# Patient Record
Sex: Female | Born: 1975 | Race: White | Hispanic: No | Marital: Married | Smoking: Current every day smoker
Health system: Southern US, Community
[De-identification: ages and names within clinical notes are randomized; demographics above are authoritative.]

## PROBLEM LIST (undated history)

## (undated) DIAGNOSIS — Z9851 Tubal ligation status: Secondary | ICD-10-CM

## (undated) DIAGNOSIS — N200 Calculus of kidney: Secondary | ICD-10-CM

## (undated) HISTORY — DX: Tubal ligation status: Z98.51

## (undated) HISTORY — PX: WISDOM TOOTH EXTRACTION: SHX21

## (undated) HISTORY — DX: Calculus of kidney: N20.0

## (undated) HISTORY — PX: LITHOTRIPSY: SUR834

## (undated) HISTORY — PX: TUBAL LIGATION: SHX77

## (undated) HISTORY — PX: PARTIAL HYSTERECTOMY: SHX80

---

## 2015-05-11 ENCOUNTER — Encounter: Payer: Self-pay | Admitting: Family

## 2015-05-11 ENCOUNTER — Ambulatory Visit (INDEPENDENT_AMBULATORY_CARE_PROVIDER_SITE_OTHER): Payer: BLUE CROSS/BLUE SHIELD | Admitting: Family

## 2015-05-11 VITALS — BP 142/99 | HR 78 | Temp 98.0°F | Ht 63.0 in | Wt 201.0 lb

## 2015-05-11 DIAGNOSIS — M545 Low back pain, unspecified: Secondary | ICD-10-CM

## 2015-05-11 DIAGNOSIS — B373 Candidiasis of vulva and vagina: Secondary | ICD-10-CM

## 2015-05-11 DIAGNOSIS — B3731 Acute candidiasis of vulva and vagina: Secondary | ICD-10-CM

## 2015-05-11 DIAGNOSIS — N200 Calculus of kidney: Secondary | ICD-10-CM | POA: Diagnosis not present

## 2015-05-11 LAB — POCT URINALYSIS DIPSTICK
BILIRUBIN UA: NEGATIVE
GLUCOSE UA: NEGATIVE
KETONES UA: NEGATIVE
Nitrite, UA: NEGATIVE
PH UA: 7.5
Protein, UA: NEGATIVE
RBC UA: NEGATIVE
SPEC GRAV UA: 1.01
Urobilinogen, UA: NEGATIVE

## 2015-05-11 LAB — POCT UA - MICROSCOPIC ONLY
Bacteria, U Microscopic: NEGATIVE
CASTS, UR, LPF, POC: NEGATIVE
CRYSTALS, UR, HPF, POC: NEGATIVE
Mucus, UA: NEGATIVE
RBC, URINE, MICROSCOPIC: NEGATIVE

## 2015-05-11 MED ORDER — HYDROCODONE-ACETAMINOPHEN 10-325 MG PO TABS: 1.0000 | ORAL_TABLET | Freq: Four times a day (QID) | ORAL | Status: DC | PRN

## 2015-05-11 MED ORDER — FLUCONAZOLE 150 MG PO TABS: 150.0000 mg | ORAL_TABLET | Freq: Once | ORAL | Status: DC

## 2015-05-11 NOTE — Patient Instructions (Signed)
Kidney Stones Kidney stones (urolithiasis) are deposits that form inside your kidneys. The intense pain is caused by the stone moving through the urinary tract. When the stone moves, the ureter goes into spasm around the stone. The stone is usually passed in the urine.  CAUSES   A disorder that makes certain neck glands produce too much parathyroid hormone (primary hyperparathyroidism).  A buildup of uric acid crystals, similar to gout in your joints.  Narrowing (stricture) of the ureter.  A kidney obstruction present at birth (congenital obstruction).  Previous surgery on the kidney or ureters.  Numerous kidney infections. SYMPTOMS   Feeling sick to your stomach (nauseous).  Throwing up (vomiting).  Blood in the urine (hematuria).  Pain that usually spreads (radiates) to the groin.  Frequency or urgency of urination. DIAGNOSIS   Taking a history and physical exam.  Blood or urine tests.  CT scan.  Occasionally, an examination of the inside of the urinary bladder (cystoscopy) is performed. TREATMENT   Observation.  Increasing your fluid intake.  Extracorporeal shock wave lithotripsy--This is a noninvasive procedure that uses shock waves to break up kidney stones.  Surgery may be needed if you have severe pain or persistent obstruction. There are various surgical procedures. Most of the procedures are performed with the use of small instruments. Only small incisions are needed to accommodate these instruments, so recovery time is minimized. The size, location, and chemical composition are all important variables that will determine the proper choice of action for you. Talk to your health care provider to better understand your situation so that you will minimize the risk of injury to yourself and your kidney.  HOME CARE INSTRUCTIONS   Drink enough water and fluids to keep your urine clear or pale yellow. This will help you to pass the stone or stone fragments.  Strain  all urine through the provided strainer. Keep all particulate matter and stones for your health care provider to see. The stone causing the pain may be as small as a grain of salt. It is very important to use the strainer each and every time you pass your urine. The collection of your stone will allow your health care provider to analyze it and verify that a stone has actually passed. The stone analysis will often identify what you can do to reduce the incidence of recurrences.  Only take over-the-counter or prescription medicines for pain, discomfort, or fever as directed by your health care provider.  Keep all follow-up visits as told by your health care provider. This is important.  Get follow-up X-rays if required. The absence of pain does not always mean that the stone has passed. It may have only stopped moving. If the urine remains completely obstructed, it can cause loss of kidney function or even complete destruction of the kidney. It is your responsibility to make sure X-rays and follow-ups are completed. Ultrasounds of the kidney can show blockages and the status of the kidney. Ultrasounds are not associated with any radiation and can be performed easily in a matter of minutes.  Make changes to your daily diet as told by your health care provider. You may be told to:  Limit the amount of salt that you eat.  Eat 5 or more servings of fruits and vegetables each day.  Limit the amount of meat, poultry, fish, and eggs that you eat.  Collect a 24-hour urine sample as told by your health care provider.You may need to collect another urine sample every 6-12   months. SEEK MEDICAL CARE IF:  You experience pain that is progressive and unresponsive to any pain medicine you have been prescribed. SEEK IMMEDIATE MEDICAL CARE IF:   Pain cannot be controlled with the prescribed medicine.  You have a fever or shaking chills.  The severity or intensity of pain increases over 18 hours and is not  relieved by pain medicine.  You develop a new onset of abdominal pain.  You feel faint or pass out.  You are unable to urinate.   This information is not intended to replace advice given to you by your health care provider. Make sure you discuss any questions you have with your health care provider.   Document Released: 07/15/2005 Document Revised: 04/05/2015 Document Reviewed: 12/16/2012 Elsevier Interactive Patient Education 2016 [employer] Dietary Guidelines to Help Prevent Kidney Stones Your risk of kidney stones can be decreased by adjusting the foods you eat. The most important thing you can do is drink enough fluid. You should drink enough fluid to keep your urine clear or pale yellow. The following guidelines provide specific information for the type of kidney stone you have had. GUIDELINES ACCORDING TO TYPE OF KIDNEY STONE Calcium Oxalate Kidney Stones  Reduce the amount of salt you eat. Foods that have a lot of salt cause your body to release excess calcium into your urine. The excess calcium can combine with a substance called oxalate to form kidney stones.  Reduce the amount of animal protein you eat if the amount you eat is excessive. Animal protein causes your body to release excess calcium into your urine. Ask your dietitian how much protein from animal sources you should be eating.  Avoid foods that are high in oxalates. If you take vitamins, they should have less than 500 mg of vitamin C. Your body turns vitamin C into oxalates. You do not need to avoid fruits and vegetables high in vitamin C. Calcium Phosphate Kidney Stones  Reduce the amount of salt you eat to help prevent the release of excess calcium into your urine.  Reduce the amount of animal protein you eat if the amount you eat is excessive. Animal protein causes your body to release excess calcium into your urine. Ask your dietitian how much protein from animal sources you should be eating.  Get enough  calcium from food or take a calcium supplement (ask your dietitian for recommendations). Food sources of calcium that do not increase your risk of kidney stones include:  Broccoli.  Dairy products, such as cheese and yogurt.  Pudding. Uric Acid Kidney Stones  Do not have more than 6 oz of animal protein per day. FOOD SOURCES Animal Protein Sources  Meat (all types).  Poultry.  Eggs.  Fish, seafood. Foods High in Salt  Salt seasonings.  Soy sauce.  Teriyaki sauce.  Cured and processed meats.  Salted crackers and snack foods.  Fast food.  Canned soups and most canned foods. Foods High in Oxalates  Grains:  Amaranth.  Barley.  Grits.  Wheat germ.  Bran.  Buckwheat flour.  All bran cereals.  Pretzels.  Whole wheat bread.  Vegetables:  Beans (wax).  Beets and beet greens.  Collard greens.  Eggplant.  Escarole.  Leeks.  Okra.  Parsley.  Rutabagas.  Spinach.  Swiss chard.  Tomato paste.  Fried potatoes.  Sweet potatoes.  Fruits:  Red currants.  Figs.  Kiwi.  Rhubarb.  Meat and Other Protein Sources:  Beans (dried).  Soy burgers and other soybean products.  Miso.    Nuts (peanuts, almonds, pecans, cashews, hazelnuts).  Nut butters.  Sesame seeds and tahini (paste made of sesame seeds).  Poppy seeds.  Beverages:  Chocolate drink mixes.  Soy milk.  Instant iced tea.  Juices made from high-oxalate fruits or vegetables.  Other:  Carob.  Chocolate.  Fruitcake.  Marmalades.   This information is not intended to replace advice given to you by your health care provider. Make sure you discuss any questions you have with your health care provider.   Document Released: 11/09/2010 Document Revised: 07/20/2013 Document Reviewed: 06/11/2013 Elsevier Interactive Patient Education 2016 [employer] Inc[employer]  

## 2015-05-11 NOTE — Progress Notes (Signed)
   Subjective:    Patient ID: Tammie Bradshaw, female    DOB: 1976-01-17, 39 y.o.   MRN: [id number]  HPI Pt presents to the office today for right lower abd pain. Pt states she has had a CT scan that showed she had a total of 8 kidney stones (4 in the right and 4 in left kidney). Pt states she is in a 7-8 out of 10 intermittent pain. Pt states she has "passed three kidney stones" in the past and this pain is the same. Pt states she is able to pass urine at this time and no blood in her urine "yet".    Review of Systems  Constitutional: Negative.   HENT: Negative.   Eyes: Negative.   Respiratory: Negative.  Negative for shortness of breath.   Cardiovascular: Negative.  Negative for palpitations.  Gastrointestinal: Negative.   Endocrine: Negative.   Genitourinary: Negative.   Musculoskeletal: Negative.   Neurological: Negative.  Negative for headaches.  Hematological: Negative.   Psychiatric/Behavioral: Negative.   All other systems reviewed and are negative.      Objective:   Physical Exam  Constitutional: She is oriented to person, place, and time. She appears well-developed and well-nourished. No distress.  HENT:  Head: Normocephalic and atraumatic.  Right Ear: External ear normal.  Left Ear: External ear normal.  Nose: Nose normal.  Mouth/Throat: Oropharynx is clear and moist.  Eyes: Pupils are equal, round, and reactive to light.  Neck: Normal range of motion. Neck supple. No thyromegaly present.  Cardiovascular: Normal rate, regular rhythm, normal heart sounds and intact distal pulses.   No murmur heard. Pulmonary/Chest: Effort normal and breath sounds normal. No respiratory distress. She has no wheezes.  Abdominal: Soft. Bowel sounds are normal. She exhibits no distension. There is no tenderness.  Musculoskeletal: Normal range of motion. She exhibits no edema or tenderness.  Neurological: She is alert and oriented to person, place, and time. She has normal reflexes. No  cranial nerve deficit.  Skin: Skin is warm and dry.  Psychiatric: She has a normal mood and affect. Her behavior is normal. Judgment and thought content normal.  Vitals reviewed.     BP 142/99 mmHg  Pulse 78  Temp(Src) 98 F (36.7 C) (Oral)  Ht 5\' 3"  (1.6 m)  Wt 201 lb (91.173 kg)  BMI 35.61 kg/m2     Assessment & Plan:  1. Right-sided low back pain without sciatica - POCT urinalysis dipstick - POCT UA - Microscopic Only  2. Kidney stone on right side -Low salt diet - Ambulatory referral to Urology - HYDROcodone-acetaminophen (NORCO) 10-325 MG tablet; Take 1 tablet by mouth every 6 (six) hours as needed.  Dispense: 45 tablet; Refill: 0  3. Yeast vaginitis -Keep clean and dry -Cotton underwear - fluconazole (DIFLUCAN) 150 MG tablet; Take 1 tablet (150 mg total) by mouth once.  Dispense: 1 tablet; Refill: 0   Jannifer Rodneyhristy Kmari Halter, FNP

## 2015-05-13 LAB — URINE CULTURE

## 2015-05-25 ENCOUNTER — Ambulatory Visit (INDEPENDENT_AMBULATORY_CARE_PROVIDER_SITE_OTHER): Payer: BLUE CROSS/BLUE SHIELD | Admitting: Family

## 2015-05-25 ENCOUNTER — Encounter: Payer: Self-pay | Admitting: Family

## 2015-05-25 VITALS — BP 137/86 | HR 76 | Temp 97.2°F | Ht 63.0 in | Wt 201.0 lb

## 2015-05-25 DIAGNOSIS — Z0000 Encounter for general adult medical examination without abnormal findings: Secondary | ICD-10-CM | POA: Diagnosis not present

## 2015-05-25 DIAGNOSIS — Z01419 Encounter for gynecological examination (general) (routine) without abnormal findings: Secondary | ICD-10-CM

## 2015-05-25 DIAGNOSIS — L989 Disorder of the skin and subcutaneous tissue, unspecified: Secondary | ICD-10-CM

## 2015-05-25 LAB — POCT UA - MICROSCOPIC ONLY
Bacteria, U Microscopic: NEGATIVE
CASTS, UR, LPF, POC: NEGATIVE
Crystals, Ur, HPF, POC: NEGATIVE
Mucus, UA: NEGATIVE
WBC, Ur, HPF, POC: NEGATIVE
YEAST UA: NEGATIVE

## 2015-05-25 LAB — POCT URINALYSIS DIPSTICK
Bilirubin, UA: NEGATIVE
Glucose, UA: NEGATIVE
Ketones, UA: NEGATIVE
Leukocytes, UA: NEGATIVE
NITRITE UA: NEGATIVE
PH UA: 5
Protein, UA: NEGATIVE
RBC UA: NEGATIVE
Spec Grav, UA: 1.015
UROBILINOGEN UA: NEGATIVE

## 2015-05-25 NOTE — Addendum Note (Signed)
Addended by: Gwenith DailyHUDY, Jimi Schappert N on: 05/25/2015 03:58 PM   Modules accepted: Orders

## 2015-05-25 NOTE — Addendum Note (Signed)
Addended by: Prescott GumLAND, Latwan Luchsinger M on: 05/25/2015 04:00 PM   Modules accepted: Kipp BroodSmartSet

## 2015-05-25 NOTE — Progress Notes (Signed)
   Subjective:    Patient ID: Tammie Bradshaw [facility], female    DOB: [birth year], 39 y.o.   MRN: [id number]  HPI Pt presents to the office today for CPE with pap. Pt currently not taking any medications. Pt states she has a rx for pain medication, but only uses if she has a kidney stone. Pt denies any headache, palpitations, SOB, or edema at this time.     Review of Systems  Constitutional: Negative.   HENT: Negative.   Eyes: Negative.   Respiratory: Negative.  Negative for shortness of breath.   Cardiovascular: Negative.  Negative for palpitations.  Gastrointestinal: Negative.   Endocrine: Negative.   Genitourinary: Negative.   Musculoskeletal: Negative.   Neurological: Negative.  Negative for headaches.  Hematological: Negative.   Psychiatric/Behavioral: Negative.   All other systems reviewed and are negative.      Objective:   Physical Exam  Constitutional: She is oriented to person, place, and time. She appears well-developed and well-nourished. No distress.  HENT:  Head: Normocephalic and atraumatic.  Right Ear: External ear normal.  Left Ear: External ear normal.  Nose: Nose normal.  Mouth/Throat: Oropharynx is clear and moist.  Eyes: Pupils are equal, round, and reactive to light.  Neck: Normal range of motion. Neck supple. No thyromegaly present.  Cardiovascular: Normal rate, regular rhythm, normal heart sounds and intact distal pulses.   No murmur heard. Pulmonary/Chest: Effort normal and breath sounds normal. No respiratory distress. She has no wheezes.  Abdominal: Soft. Bowel sounds are normal. She exhibits no distension. There is no tenderness.  Musculoskeletal: Normal range of motion. She exhibits no edema or tenderness.  Neurological: She is alert and oriented to person, place, and time. She has normal reflexes. No cranial nerve deficit.  Skin: Skin is warm and dry.  Psychiatric: She has a normal mood and affect. Her behavior is normal. Judgment and thought content  normal.  Vitals reviewed.   BP 137/86 mmHg  Pulse 76  Temp(Src) 97.2 F (36.2 C) (Oral)  Ht _0  (1.6 m)  Wt 201 lb (91.173 kg)  BMI 35.61 kg/m2       Assessment & Plan:  1. Annual physical exam - Anemia Profile B - CMP14+EGFR - Lipid panel - Vit D  25 hydroxy (rtn osteoporosis monitoring) - Thyroid Panel With TSH - Pap IG w/ reflex to HPV when ASC-U  2. Encounter for routine gynecological examination - Pap IG w/ reflex to HPV when ASC-U  3. Skin lesion - Ambulatory referral to Dermatology   Continue all meds Labs pending Health Maintenance reviewed Diet and exercise encouraged RTO 1 year  Evelina Dun, FNP

## 2015-05-25 NOTE — Patient Instructions (Signed)
Health Maintenance, Female Adopting a healthy lifestyle and getting preventive care can go a long way to promote health and wellness. Talk with your health care provider about what schedule of regular examinations is right for you. This is a good chance for you to check in with your provider about disease prevention and staying healthy. In between checkups, there are plenty of things you can do on your own. Experts have done a lot of research about which lifestyle changes and preventive measures are most likely to keep you healthy. Ask your health care provider for more information. WEIGHT AND DIET  Eat a healthy diet  Be sure to include plenty of vegetables, fruits, low-fat dairy products, and lean protein.  Do not eat a lot of foods high in solid fats, added sugars, or salt.  Get regular exercise. This is one of the most important things you can do for your health.  Most adults should exercise for at least 150 minutes each week. The exercise should increase your heart rate and make you sweat (moderate-intensity exercise).  Most adults should also do strengthening exercises at least twice a week. This is in addition to the moderate-intensity exercise.  Maintain a healthy weight  Body mass index (BMI) is a measurement that can be used to identify possible weight problems. It estimates body fat based on height and weight. Your health care provider can help determine your BMI and help you achieve or maintain a healthy weight.  For females 20 years of age and older:   A BMI below 18.5 is considered underweight.  A BMI of 18.5 to 24.9 is normal.  A BMI of 25 to 29.9 is considered overweight.  A BMI of 30 and above is considered obese.  Watch levels of cholesterol and blood lipids  You should start having your blood tested for lipids and cholesterol at 37 years of age, then have this test every 5 years.  You may need to have your cholesterol levels checked more often if:  Your lipid  or cholesterol levels are high.  You are older than 37 years of age.  You are at high risk for heart disease.  CANCER SCREENING   Lung Cancer  Lung cancer screening is recommended for adults 55-80 years old who are at high risk for lung cancer because of a history of smoking.  A yearly low-dose CT scan of the lungs is recommended for people who:  Currently smoke.  Have quit within the past 15 years.  Have at least a 30-pack-year history of smoking. A pack year is smoking an average of one pack of cigarettes a day for 1 year.  Yearly screening should continue until it has been 15 years since you quit.  Yearly screening should stop if you develop a health problem that would prevent you from having lung cancer treatment.  Breast Cancer  Practice breast self-awareness. This means understanding how your breasts normally appear and feel.  It also means doing regular breast self-exams. Let your health care provider know about any changes, no matter how small.  If you are in your 20s or 30s, you should have a clinical breast exam (CBE) by a health care provider every 1-3 years as part of a regular health exam.  If you are 40 or older, have a CBE every year. Also consider having a breast X-ray (mammogram) every year.  If you have a family history of breast cancer, talk to your health care provider about genetic screening.  If you   are at high risk for breast cancer, talk to your health care provider about having an MRI and a mammogram every year.  Breast cancer gene (BRCA) assessment is recommended for women who have family members with BRCA-related cancers. BRCA-related cancers include:  Breast.  Ovarian.  Tubal.  Peritoneal cancers.  Results of the assessment will determine the need for genetic counseling and BRCA1 and BRCA2 testing. Cervical Cancer Your health care provider may recommend that you be screened regularly for cancer of the pelvic organs (ovaries, uterus, and  vagina). This screening involves a pelvic examination, including checking for microscopic changes to the surface of your cervix (Pap test). You may be encouraged to have this screening done every 3 years, beginning at age 21.  For women ages 30-65, health care providers may recommend pelvic exams and Pap testing every 3 years, or they may recommend the Pap and pelvic exam, combined with testing for human papilloma virus (HPV), every 5 years. Some types of HPV increase your risk of cervical cancer. Testing for HPV may also be done on women of any age with unclear Pap test results.  Other health care providers may not recommend any screening for nonpregnant women who are considered low risk for pelvic cancer and who do not have symptoms. Ask your health care provider if a screening pelvic exam is right for you.  If you have had past treatment for cervical cancer or a condition that could lead to cancer, you need Pap tests and screening for cancer for at least 20 years after your treatment. If Pap tests have been discontinued, your risk factors (such as having a new sexual partner) need to be reassessed to determine if screening should resume. Some women have medical problems that increase the chance of getting cervical cancer. In these cases, your health care provider may recommend more frequent screening and Pap tests. Colorectal Cancer  This type of cancer can be detected and often prevented.  Routine colorectal cancer screening usually begins at 37 years of age and continues through 37 years of age.  Your health care provider may recommend screening at an earlier age if you have risk factors for colon cancer.  Your health care provider may also recommend using home test kits to check for hidden blood in the stool.  A small camera at the end of a tube can be used to examine your colon directly (sigmoidoscopy or colonoscopy). This is done to check for the earliest forms of colorectal  cancer.  Routine screening usually begins at age 50.  Direct examination of the colon should be repeated every 5-10 years through 37 years of age. However, you may need to be screened more often if early forms of precancerous polyps or small growths are found. Skin Cancer  Check your skin from head to toe regularly.  Tell your health care provider about any new moles or changes in moles, especially if there is a change in a mole's shape or color.  Also tell your health care provider if you have a mole that is larger than the size of a pencil eraser.  Always use sunscreen. Apply sunscreen liberally and repeatedly throughout the day.  Protect yourself by wearing long sleeves, pants, a wide-brimmed hat, and sunglasses whenever you are outside. HEART DISEASE, DIABETES, AND HIGH BLOOD PRESSURE   High blood pressure causes heart disease and increases the risk of stroke. High blood pressure is more likely to develop in:  People who have blood pressure in the high end   of the normal range (130-139/85-89 mm Hg).  People who are overweight or obese.  People who are African American.  If you are 38-23 years of age, have your blood pressure checked every 3-5 years. If you are 61 years of age or older, have your blood pressure checked every year. You should have your blood pressure measured twice--once when you are at a hospital or clinic, and once when you are not at a hospital or clinic. Record the average of the two measurements. To check your blood pressure when you are not at a hospital or clinic, you can use:  An automated blood pressure machine at a pharmacy.  A home blood pressure monitor.  If you are between 45 years and 39 years old, ask your health care provider if you should take aspirin to prevent strokes.  Have regular diabetes screenings. This involves taking a blood sample to check your fasting blood sugar level.  If you are at a normal weight and have a low risk for diabetes,  have this test once every three years after 37 years of age.  If you are overweight and have a high risk for diabetes, consider being tested at a younger age or more often. PREVENTING INFECTION  Hepatitis B  If you have a higher risk for hepatitis B, you should be screened for this virus. You are considered at high risk for hepatitis B if:  You were born in a country where hepatitis B is common. Ask your health care provider which countries are considered high risk.  Your parents were born in a high-risk country, and you have not been immunized against hepatitis B (hepatitis B vaccine).  You have HIV or AIDS.  You use needles to inject street drugs.  You live with someone who has hepatitis B.  You have had sex with someone who has hepatitis B.  You get hemodialysis treatment.  You take certain medicines for conditions, including cancer, organ transplantation, and autoimmune conditions. Hepatitis C  Blood testing is recommended for:  Everyone born from [birth year] through 1963.  Anyone with known risk factors for hepatitis C. Sexually transmitted infections (STIs)  You should be screened for sexually transmitted infections (STIs) including gonorrhea and chlamydia if:  You are sexually active and are younger than 37 years of age.  You are older than 37 years of age and your health care provider tells you that you are at risk for this type of infection.  Your sexual activity has changed since you were last screened and you are at an increased risk for chlamydia or gonorrhea. Ask your health care provider if you are at risk.  If you do not have HIV, but are at risk, it may be recommended that you take a prescription medicine daily to prevent HIV infection. This is called pre-exposure prophylaxis (PrEP). You are considered at risk if:  You are sexually active and do not regularly use condoms or know the HIV status of your partner(s).  You take drugs by injection.  You are sexually  active with a partner who has HIV. Talk with your health care provider about whether you are at high risk of being infected with HIV. If you choose to begin PrEP, you should first be tested for HIV. You should then be tested every 3 months for as long as you are taking PrEP.  PREGNANCY   If you are premenopausal and you may become pregnant, ask your health care provider about preconception counseling.  If you may  become pregnant, take 400 to 800 micrograms (mcg) of folic acid every day.  If you want to prevent pregnancy, talk to your health care provider about birth control (contraception). OSTEOPOROSIS AND MENOPAUSE   Osteoporosis is a disease in which the bones lose minerals and strength with aging. This can result in serious bone fractures. Your risk for osteoporosis can be identified using a bone density scan.  If you are 61 years of age or older, or if you are at risk for osteoporosis and fractures, ask your health care provider if you should be screened.  Ask your health care provider whether you should take a calcium or vitamin D supplement to lower your risk for osteoporosis.  Menopause may have certain physical symptoms and risks.  Hormone replacement therapy may reduce some of these symptoms and risks. Talk to your health care provider about whether hormone replacement therapy is right for you.  HOME CARE INSTRUCTIONS   Schedule regular health, dental, and eye exams.  Stay current with your immunizations.   Do not use any tobacco products including cigarettes, chewing tobacco, or electronic cigarettes.  If you are pregnant, do not drink alcohol.  If you are breastfeeding, limit how much and how often you drink alcohol.  Limit alcohol intake to no more than 1 drink per day for nonpregnant women. One drink equals 12 ounces of beer, 5 ounces of wine, or 1 ounces of hard liquor.  Do not use street drugs.  Do not share needles.  Ask your health care provider for help if  you need support or information about quitting drugs.  Tell your health care provider if you often feel depressed.  Tell your health care provider if you have ever been abused or do not feel safe at home.   This information is not intended to replace advice given to you by your health care provider. Make sure you discuss any questions you have with your health care provider.   Document Released: 01/28/2011 Document Revised: 08/05/2014 Document Reviewed: 06/16/2013 Elsevier Interactive Patient Education [employer].

## 2015-05-26 LAB — ANEMIA PROFILE B
BASOS ABS: 0 10*3/uL (ref 0.0–0.2)
Basos: 0 %
EOS (ABSOLUTE): 0.3 10*3/uL (ref 0.0–0.4)
Eos: 3 %
Ferritin: 10 ng/mL — ABNORMAL LOW (ref 15–150)
Folate: 4.2 ng/mL (ref >3.0)
HEMATOCRIT: 31.1 % — AB (ref 34.0–46.6)
HEMOGLOBIN: 10.2 g/dL — AB (ref 11.1–15.9)
IMMATURE GRANULOCYTES: 0 %
IRON: 19 ug/dL — AB (ref 27–159)
Immature Grans (Abs): 0 10*3/uL (ref 0.0–0.1)
Iron Saturation: 5 % — CL (ref 15–55)
LYMPHS ABS: 3.2 10*3/uL — AB (ref 0.7–3.1)
Lymphs: 35 %
MCH: 25.8 pg — ABNORMAL LOW (ref 26.6–33.0)
MCHC: 32.8 g/dL (ref 31.5–35.7)
MCV: 79 fL (ref 79–97)
MONOCYTES: 7 %
Monocytes Absolute: 0.6 10*3/uL (ref 0.1–0.9)
NEUTROS ABS: 5 10*3/uL (ref 1.4–7.0)
NEUTROS PCT: 55 %
PLATELETS: 390 10*3/uL — AB (ref 150–379)
RBC: 3.96 x10E6/uL (ref 3.77–5.28)
RDW: 16.1 % — AB (ref 12.3–15.4)
RETIC CT PCT: 1.9 % (ref 0.6–2.6)
Total Iron Binding Capacity: 378 ug/dL (ref 250–450)
UIBC: 359 ug/dL (ref 131–425)
VITAMIN B 12: 483 pg/mL (ref 211–946)
WBC: 9.3 10*3/uL (ref 3.4–10.8)

## 2015-05-26 LAB — LIPID PANEL
Chol/HDL Ratio: 4.9 ratio units — ABNORMAL HIGH (ref 0.0–4.4)
Cholesterol, Total: 206 mg/dL — ABNORMAL HIGH (ref 100–199)
HDL: 42 mg/dL (ref >39)
LDL Calculated: 129 mg/dL — ABNORMAL HIGH (ref 0–99)
Triglycerides: 176 mg/dL — ABNORMAL HIGH (ref 0–149)
VLDL Cholesterol Cal: 35 mg/dL (ref 5–40)

## 2015-05-26 LAB — CMP14+EGFR
A/G RATIO: 1.6 (ref 1.1–2.5)
ALBUMIN: 4.1 g/dL (ref 3.5–5.5)
ALT: 14 IU/L (ref 0–32)
AST: 9 IU/L (ref 0–40)
Alkaline Phosphatase: 60 IU/L (ref 39–117)
BILIRUBIN TOTAL: 0.2 mg/dL (ref 0.0–1.2)
BUN / CREAT RATIO: 20 (ref 8–20)
BUN: 15 mg/dL (ref 6–20)
CHLORIDE: 101 mmol/L (ref 97–106)
CO2: 24 mmol/L (ref 18–29)
Calcium: 9 mg/dL (ref 8.7–10.2)
Creatinine, Ser: 0.75 mg/dL (ref 0.57–1.00)
GFR calc non Af Amer: 103 mL/min/{1.73_m2} (ref >59)
GFR, EST AFRICAN AMERICAN: 119 mL/min/{1.73_m2} (ref >59)
Globulin, Total: 2.6 g/dL (ref 1.5–4.5)
Glucose: 79 mg/dL (ref 65–99)
POTASSIUM: 4.6 mmol/L (ref 3.5–5.2)
SODIUM: 140 mmol/L (ref 136–144)
TOTAL PROTEIN: 6.7 g/dL (ref 6.0–8.5)

## 2015-05-26 LAB — THYROID PANEL WITH TSH
FREE THYROXINE INDEX: 2.3 (ref 1.2–4.9)
T3 UPTAKE RATIO: 29 % (ref 24–39)
T4 TOTAL: 7.9 ug/dL (ref 4.5–12.0)
TSH: 2.09 u[IU]/mL (ref 0.450–4.500)

## 2015-05-26 LAB — VITAMIN D 25 HYDROXY (VIT D DEFICIENCY, FRACTURES): Vit D, 25-Hydroxy: 16.3 ng/mL — ABNORMAL LOW (ref 30.0–100.0)

## 2015-05-29 LAB — PAP IG W/ RFLX HPV ASCU: PAP SMEAR COMMENT: 0

## 2015-05-30 ENCOUNTER — Encounter: Payer: Self-pay | Admitting: *Deleted

## 2015-05-30 ENCOUNTER — Other Ambulatory Visit: Payer: Self-pay | Admitting: Family

## 2015-05-30 ENCOUNTER — Telehealth: Payer: Self-pay | Admitting: Family

## 2015-05-30 DIAGNOSIS — E785 Hyperlipidemia, unspecified: Secondary | ICD-10-CM

## 2015-05-30 DIAGNOSIS — E559 Vitamin D deficiency, unspecified: Secondary | ICD-10-CM

## 2015-05-30 DIAGNOSIS — D509 Iron deficiency anemia, unspecified: Secondary | ICD-10-CM

## 2015-05-30 MED ORDER — VITAMIN D (ERGOCALCIFEROL) 1.25 MG (50000 UNIT) PO CAPS: 50000.0000 [IU] | ORAL_CAPSULE | ORAL | Status: DC

## 2015-05-30 MED ORDER — ATORVASTATIN CALCIUM 20 MG PO TABS: 20.0000 mg | ORAL_TABLET | Freq: Every day | ORAL | Status: DC

## 2015-06-07 NOTE — Telephone Encounter (Signed)
Patient aware of labs on 11/2

## 2015-06-08 ENCOUNTER — Ambulatory Visit: Payer: BLUE CROSS/BLUE SHIELD | Admitting: Pediatrics

## 2015-06-09 ENCOUNTER — Encounter: Payer: Self-pay | Admitting: Family

## 2015-08-04 ENCOUNTER — Ambulatory Visit (INDEPENDENT_AMBULATORY_CARE_PROVIDER_SITE_OTHER): Payer: BLUE CROSS/BLUE SHIELD | Admitting: Family Medicine

## 2015-08-04 ENCOUNTER — Encounter: Payer: Self-pay | Admitting: Family Medicine

## 2015-08-04 VITALS — BP 143/81 | HR 75 | Temp 97.3°F | Ht 63.0 in | Wt 204.4 lb

## 2015-08-04 DIAGNOSIS — S61011A Laceration without foreign body of right thumb without damage to nail, initial encounter: Secondary | ICD-10-CM | POA: Diagnosis not present

## 2015-08-04 DIAGNOSIS — S61219A Laceration without foreign body of unspecified finger without damage to nail, initial encounter: Secondary | ICD-10-CM

## 2015-08-07 NOTE — Progress Notes (Addendum)
   Subjective:  Patient ID: Tammie Bradshaw, female    DOB: 1975-08-06  Age: 40 y.o. MRN: [id number]  CC: Suture / Staple Removal   HPI Tammie Bradshaw presents for removal of sutures placed in right thumb due to laceration. Sutured at [facility] About 9 days ago. Now beginning to redden and itch. Sore, but not painful. Denies loss of ROM  History Tammie Bradshaw has a past medical history of Kidney stones.   She has past surgical history that includes Tubal ligation.   Her family history includes Heart disease in her mother.She reports that she has been smoking.  She has never used smokeless tobacco. She reports that she does not drink alcohol or use illicit drugs.  Current Outpatient Prescriptions on File Prior to Visit  Medication Sig Dispense Refill  . atorvastatin (LIPITOR) 20 MG tablet Take 1 tablet (20 mg total) by mouth daily. (Patient not taking: Reported on 08/04/2015) 90 tablet 1  . HYDROcodone-acetaminophen (NORCO) 10-325 MG tablet Take 1 tablet by mouth every 6 (six) hours as needed. (Patient not taking: Reported on 08/04/2015) 45 tablet 0  . Vitamin D, Ergocalciferol, (DRISDOL) 50000 UNITS CAPS capsule Take 1 capsule (50,000 Units total) by mouth every 7 (seven) days. (Patient not taking: Reported on 08/04/2015) 12 capsule 3   No current facility-administered medications on file prior to visit.    ROS Review of Systems  Constitutional: Negative.   HENT: Negative.   Respiratory: Negative.   Cardiovascular: Negative.     Objective:  BP 143/81 mmHg  Pulse 75  Temp(Src) 97.3 F (36.3 C) (Oral)  Ht 5\' 3"  (1.6 m)  Wt 204 lb 6.4 oz (92.715 kg)  BMI 36.22 kg/m2  SpO2 97%  LMP 07/18/2015  Physical Exam  Musculoskeletal: Normal range of motion. She exhibits no edema.  Skin:  Mild suture hyperemia noted a t site. Sutures removed without difficulty. Steristrip applied. Wound care reviewed.     Assessment & Plan:   Tammie Bradshaw was seen today for suture / staple removal.  Diagnoses and all  orders for this visit:  Laceration of finger, initial encounter   I am having Tammie Bradshaw maintain her HYDROcodone-acetaminophen, Vitamin D (Ergocalciferol), and atorvastatin.  No orders of the defined types were placed in this encounter.     Follow-up: Return if symptoms worsen or fail to improve.  Mechele ClaudeWarren Luevenia Mcavoy, M.D.

## 2015-09-15 ENCOUNTER — Encounter: Payer: Self-pay | Admitting: Family

## 2015-09-15 ENCOUNTER — Ambulatory Visit (INDEPENDENT_AMBULATORY_CARE_PROVIDER_SITE_OTHER): Payer: BLUE CROSS/BLUE SHIELD | Admitting: Family

## 2015-09-15 VITALS — BP 148/101 | HR 93 | Temp 99.3°F | Ht 63.0 in | Wt 191.0 lb

## 2015-09-15 DIAGNOSIS — R6889 Other general symptoms and signs: Secondary | ICD-10-CM

## 2015-09-15 DIAGNOSIS — J101 Influenza due to other identified influenza virus with other respiratory manifestations: Secondary | ICD-10-CM

## 2015-09-15 LAB — POCT INFLUENZA A/B
Influenza A, POC: POSITIVE — AB
Influenza B, POC: NEGATIVE

## 2015-09-15 LAB — POCT RAPID STREP A (OFFICE): Rapid Strep A Screen: NEGATIVE

## 2015-09-15 MED ORDER — OSELTAMIVIR PHOSPHATE 75 MG PO CAPS
75.0000 mg | ORAL_CAPSULE | Freq: Two times a day (BID) | ORAL | Status: DC
Start: 2015-09-15 — End: 2016-02-01

## 2015-09-15 NOTE — Addendum Note (Signed)
Addended by: Jannifer Rodney A on: 09/15/2015 12:11 PM   Modules accepted: SmartSet

## 2015-09-15 NOTE — Patient Instructions (Signed)

## 2015-09-15 NOTE — Progress Notes (Signed)
   Subjective:    Patient ID: Carron Mcmurry [facility], female    DOB: 07/22/1976, 40 y.o.   MRN: [id number]  Headache  This is a new problem. Associated symptoms include coughing, ear pain, a fever, rhinorrhea and a sore throat.  Sore Throat  This is a new problem. The current episode started in the past 7 days. The problem has been rapidly worsening. The maximum temperature recorded prior to her arrival was 100.4 - 100.9 F. Associated symptoms include coughing, ear pain, headaches and shortness of breath.  Cough This is a new problem. The current episode started in the past 7 days. The problem has been rapidly worsening. The problem occurs every few minutes. The cough is non-productive. Associated symptoms include chills, ear pain, a fever, headaches, myalgias, nasal congestion, postnasal drip, rhinorrhea, a sore throat and shortness of breath. Pertinent negatives include no ear congestion. The symptoms are aggravated by lying down. She has tried OTC cough suppressant and rest for the symptoms. The treatment provided mild relief. There is no history of asthma or COPD.      Review of Systems  Constitutional: Positive for fever and chills.  HENT: Positive for ear pain, postnasal drip, rhinorrhea and sore throat.   Eyes: Negative.   Respiratory: Positive for cough and shortness of breath.   Cardiovascular: Negative.  Negative for palpitations.  Gastrointestinal: Negative.   Endocrine: Negative.   Genitourinary: Negative.   Musculoskeletal: Positive for myalgias.  Neurological: Positive for headaches.  Hematological: Negative.   Psychiatric/Behavioral: Negative.   All other systems reviewed and are negative.      Objective:   Physical Exam  Constitutional: She is oriented to person, place, and time. She appears well-developed and well-nourished. No distress.  HENT:  Head: Normocephalic and atraumatic.  Right Ear: External ear normal.  Mouth/Throat: Oropharynx is clear and moist.  Eyes:  Pupils are equal, round, and reactive to light.  Neck: Normal range of motion. Neck supple. No thyromegaly present.  Cardiovascular: Normal rate, regular rhythm, normal heart sounds and intact distal pulses.   No murmur heard. Pulmonary/Chest: Effort normal and breath sounds normal. No respiratory distress. She has no wheezes.  Abdominal: Soft. Bowel sounds are normal. She exhibits no distension. There is no tenderness.  Musculoskeletal: Normal range of motion. She exhibits no edema or tenderness.  Neurological: She is alert and oriented to person, place, and time. She has normal reflexes. No cranial nerve deficit.  Skin: Skin is warm and dry.  Psychiatric: She has a normal mood and affect. Her behavior is normal. Judgment and thought content normal.  Vitals reviewed.     BP 148/101 mmHg  Pulse 93  Temp(Src) 99.3 F (37.4 C) (Oral)  Ht  (1.6 m)  Wt 191 lb (86.637 kg)  BMI 33.84 kg/m2     Assessment & Plan:  1. Flu-like symptoms - POCT Influenza A/B - POCT rapid strep A  2. Influenza A - oseltamivir (TAMIFLU) 75 MG capsule; Take 1 capsule (75 mg total) by mouth 2 (two) times daily.  Dispense: 10 capsule; Refill: 0 Rest Force fluids Take tylenol or motrin prn for pain and fever Discussed the importance of covering mouth and nose when coughing or sneezing, and good hand hygiene  RTO prn   Jannifer Rodney, FNP

## 2015-11-16 ENCOUNTER — Encounter: Payer: Self-pay | Admitting: Family

## 2015-12-04 DIAGNOSIS — N200 Calculus of kidney: Secondary | ICD-10-CM | POA: Diagnosis not present

## 2015-12-05 DIAGNOSIS — E785 Hyperlipidemia, unspecified: Secondary | ICD-10-CM | POA: Diagnosis not present

## 2015-12-05 DIAGNOSIS — Z01818 Encounter for other preprocedural examination: Secondary | ICD-10-CM | POA: Diagnosis not present

## 2015-12-05 DIAGNOSIS — Z87442 Personal history of urinary calculi: Secondary | ICD-10-CM | POA: Diagnosis not present

## 2015-12-05 DIAGNOSIS — I10 Essential (primary) hypertension: Secondary | ICD-10-CM | POA: Diagnosis not present

## 2015-12-05 DIAGNOSIS — N200 Calculus of kidney: Secondary | ICD-10-CM | POA: Diagnosis not present

## 2015-12-05 DIAGNOSIS — Z9851 Tubal ligation status: Secondary | ICD-10-CM | POA: Diagnosis not present

## 2015-12-05 DIAGNOSIS — E669 Obesity, unspecified: Secondary | ICD-10-CM | POA: Diagnosis not present

## 2015-12-05 DIAGNOSIS — Z6831 Body mass index (BMI) 31.0-31.9, adult: Secondary | ICD-10-CM | POA: Diagnosis not present

## 2015-12-05 DIAGNOSIS — F17210 Nicotine dependence, cigarettes, uncomplicated: Secondary | ICD-10-CM | POA: Diagnosis not present

## 2015-12-05 DIAGNOSIS — K219 Gastro-esophageal reflux disease without esophagitis: Secondary | ICD-10-CM | POA: Diagnosis not present

## 2015-12-05 DIAGNOSIS — Z79899 Other long term (current) drug therapy: Secondary | ICD-10-CM | POA: Diagnosis not present

## 2016-01-28 DIAGNOSIS — K219 Gastro-esophageal reflux disease without esophagitis: Secondary | ICD-10-CM | POA: Diagnosis not present

## 2016-01-28 DIAGNOSIS — M25551 Pain in right hip: Secondary | ICD-10-CM | POA: Diagnosis not present

## 2016-01-28 DIAGNOSIS — F17200 Nicotine dependence, unspecified, uncomplicated: Secondary | ICD-10-CM | POA: Diagnosis not present

## 2016-01-28 DIAGNOSIS — M7071 Other bursitis of hip, right hip: Secondary | ICD-10-CM | POA: Diagnosis not present

## 2016-01-28 DIAGNOSIS — Z79899 Other long term (current) drug therapy: Secondary | ICD-10-CM | POA: Diagnosis not present

## 2016-01-31 ENCOUNTER — Telehealth: Payer: Self-pay | Admitting: Family

## 2016-01-31 NOTE — Telephone Encounter (Signed)
[facility] for right hip pain, needs followup.  Appointment was scheduled for 02/01/16 with [name].  ER records were requested.

## 2016-02-01 ENCOUNTER — Ambulatory Visit (INDEPENDENT_AMBULATORY_CARE_PROVIDER_SITE_OTHER): Payer: BLUE CROSS/BLUE SHIELD | Admitting: Pediatrics

## 2016-02-01 ENCOUNTER — Encounter: Payer: Self-pay | Admitting: Pediatrics

## 2016-02-01 VITALS — BP 129/90 | HR 86 | Temp 96.9°F | Ht 63.0 in | Wt 195.2 lb

## 2016-02-01 DIAGNOSIS — M7061 Trochanteric bursitis, right hip: Secondary | ICD-10-CM

## 2016-02-01 NOTE — Progress Notes (Signed)
    Subjective:    Patient ID: [facility]    DOB: 06-28-1976, 40 y.o.   MRN: [id number]  CC: Hospitalization Follow-up hip pain  HPI: Tammie Bradshaw is a 40 y.o. female presenting for Hospitalization Follow-up  Seen in ED four days ago for hip pain, told it was bursitis. 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  Interim medical history since our last visit reviewed. Allergies and medications reviewed and updated.  ROS: Per HPI unless specifically indicated above  History  Smoking status  . Current Every Day Smoker -- 0.50 packs/day  Smokeless tobacco  . Never Used       Objective:    BP 129/90 mmHg  Pulse 86  Temp(Src) 96.9 F (36.1 C) (Oral)  Ht 5\' 3"  (1.6 m)  Wt 195 lb 3.2 oz (88.542 kg)  BMI 34.59 kg/m2  Wt Readings from Last 3 Encounters:  02/01/16 195 lb 3.2 oz (88.542 kg)  09/15/15 191 lb (86.637 kg)  08/04/15 204 lb 6.4 oz (92.715 kg)     Gen: NAD, alert, cooperative with exam, NCAT EYES: EOMI, no scleral injection or icterus Ext: No edema, warm Neuro: Alert and oriented, strength equal b/l UE and LE, coordination grossly normal MSK: pain with palpation over R greater trochanter, ROM R hip slightly limited due to pain in knee at IT band insertion. pain with palpation along IT band       Assessment & Plan:    Tammie Bradshaw was seen today for hospitalization follow-up. Records were reviewed.  Diagnoses and all orders for this visit:  Trochanteric bursitis of right hip Pain improving with NSAIDs but walking still limited due to pain Also with tightness of IT band Discussed options including steroid injection, pt opted to proceed. See separate procedure note Rec rest, cont NSAIDs, gave exercises RTC in 1 week if not improving, can refer to PT Note for work given   PROCEDURE: Trochanteric bursa injection Verbal consent was obtained from the patient. Risks including infection, bleeding explained and contrasted with benefits and alternatives. Patient prepped with alcohol. R Trochanteric bursa was injected. The patient tolerated the procedure well. No complications. Pt able to walk to waiting room, weight bearing on leg. Injection: 2mL bupivacaine and  Kenalog 40 mg.   Follow up plan: Return in about 1 week (around 02/08/2016), or if symptoms worsen or fail to improve.  Rex Krasarol Lurlene Ronda, MD [facility] 02/01/2016, 9:35 AM

## 2016-02-01 NOTE — Patient Instructions (Signed)
Trochanteric Bursitis You have hip pain due to trochanteric bursitis. Bursitis means that the sack near the outside of the hip is filled with fluid and inflamed. This sack is made up of protective soft tissue. The pain from trochanteric bursitis can be severe and keep you from sleep. It can radiate to the buttocks or down the outside of the thigh to the knee. The pain is almost always worse when rising from the seated or lying position and with walking. Pain can improve after you take a few steps. It happens more often in people with hip joint and lumbar spine problems, such as arthritis or previous surgery. Very rarely the trochanteric bursa can become infected, and antibiotics and/or surgery may be needed. Treatment often includes an injection of local anesthetic mixed with cortisone medicine. This medicine is injected into the area where it is most tender over the hip. Repeat injections may be necessary if the response to treatment is slow. You can apply ice packs over the tender area for 30 minutes every 2 hours for the next few days. Anti-inflammatory and/or narcotic pain medicine may also be helpful. Limit your activity for the next few days if the pain continues. See your caregiver in 5-10 days if you are not greatly improved.  SEEK IMMEDIATE MEDICAL CARE IF:  You develop severe pain, fever, or increased redness.  You have pain that radiates below the knee. EXERCISES STRETCHING EXERCISES - Trochanteric Bursitis  These exercises may help you when beginning to rehabilitate your injury. Your symptoms may resolve with or without further involvement from your physician, physical therapist, or athletic trainer. While completing these exercises, remember:   Restoring tissue flexibility helps normal motion to return to the joints. This allows healthier, less painful movement and activity.  An effective stretch should be held for at least 30 seconds.  A stretch should never be painful. You should only  feel a gentle lengthening or release in the stretched tissue. STRETCH - Iliotibial Band  On the floor or bed, lie on your side so your injured leg is on top. Bend your knee and grab your ankle.  Slowly bring your knee back so that your thigh is in line with your trunk. Keep your heel at your buttocks and gently arch your back so your head, shoulders and hips line up.  Slowly lower your leg so that your knee approaches the floor/bed until you feel a gentle stretch on the outside of your thigh. If you do not feel a stretch and your knee will not fall farther, place the heel of your opposite foot on top of your knee and pull your thigh down farther.  Hold this stretch for __________ seconds.  Repeat __________ times. Complete this exercise __________ times per day. STRETCH - Hamstrings, Supine   Lie on your back. Loop a belt or towel over the ball of your foot as shown.  Straighten your knee and slowly pull on the belt to raise your injured leg. Do not allow the knee to bend. Keep your opposite leg flat on the floor.  Raise the leg until you feel a gentle stretch behind your knee or thigh. Hold this position for __________ seconds.  Repeat __________ times. Complete this stretch __________ times per day. STRETCH - Quadriceps, Prone   Lie on your stomach on a firm surface, such as a bed or padded floor.  Bend your knee and grasp your ankle. If you are unable to reach your ankle or pant leg, use a belt   around your foot to lengthen your reach.  Gently pull your heel toward your buttocks. Your knee should not slide out to the side. You should feel a stretch in the front of your thigh and/or knee.  Hold this position for __________ seconds.  Repeat __________ times. Complete this stretch __________ times per day. STRETCHING - Hip Flexors, Lunge Half kneel with your knee on the floor and your opposite knee bent and directly over your ankle.  Keep good posture with your head over your  shoulders. Tighten your buttocks to point your tailbone downward; this will prevent your back from arching too much.  You should feel a gentle stretch in the front of your thigh and/or hip. If you do not feel any resistance, slightly slide your opposite foot forward and then slowly lunge forward so your knee once again lines up over your ankle. Be sure your tailbone remains pointed downward.  Hold this stretch for __________ seconds.  Repeat __________ times. Complete this stretch __________ times per day. STRETCH - Adductors, Lunge  While standing, spread your legs.  Lean away from your injured leg by bending your opposite knee. You may rest your hands on your thigh for balance.  You should feel a stretch in your inner thigh. Hold for __________ seconds.  Repeat __________ times. Complete this exercise __________ times per day.   This information is not intended to replace advice given to you by your health care provider. Make sure you discuss any questions you have with your health care provider.   Document Released: 08/22/2004 Document Revised: 11/29/2014 Document Reviewed: 10/27/2008 Elsevier Interactive Patient Education 2016 Elsevier Inc.  

## 2016-04-05 DIAGNOSIS — Z029 Encounter for administrative examinations, unspecified: Secondary | ICD-10-CM

## 2016-04-08 ENCOUNTER — Telehealth: Payer: Self-pay | Admitting: Family

## 2016-04-12 ENCOUNTER — Encounter: Payer: Self-pay | Admitting: Nurse Practitioner

## 2016-04-12 ENCOUNTER — Ambulatory Visit (INDEPENDENT_AMBULATORY_CARE_PROVIDER_SITE_OTHER): Payer: BLUE CROSS/BLUE SHIELD | Admitting: Nurse Practitioner

## 2016-04-12 ENCOUNTER — Ambulatory Visit (INDEPENDENT_AMBULATORY_CARE_PROVIDER_SITE_OTHER): Payer: BLUE CROSS/BLUE SHIELD

## 2016-04-12 VITALS — BP 144/88 | HR 80 | Temp 97.3°F | Ht 63.0 in | Wt 198.0 lb

## 2016-04-12 DIAGNOSIS — K5900 Constipation, unspecified: Secondary | ICD-10-CM | POA: Diagnosis not present

## 2016-04-12 DIAGNOSIS — R319 Hematuria, unspecified: Secondary | ICD-10-CM

## 2016-04-12 DIAGNOSIS — R109 Unspecified abdominal pain: Secondary | ICD-10-CM | POA: Diagnosis not present

## 2016-04-12 MED ORDER — KETOROLAC TROMETHAMINE 60 MG/2ML IM SOLN
60.0000 mg | Freq: Once | INTRAMUSCULAR | Status: AC
  Administered 2016-04-12: 60 mg via INTRAMUSCULAR

## 2016-04-12 NOTE — Patient Instructions (Signed)

## 2016-04-12 NOTE — Progress Notes (Signed)
   Subjective:    Patient ID: Tammie Bradshaw, female    DOB: 1975-08-23, 40 y.o.   MRN: [id number]  HPI  Patient comes in c/o right flank pain- exact same pain that she has when she has a kidney stone- she has had 12 in the past. Had lithotripsy in June and past several stones then. Rates pain 10/10- cannot get comfortable.   Review of Systems  Constitutional: Negative.   HENT: Negative.   Respiratory: Negative.   Cardiovascular: Negative.   Genitourinary: Negative.        Blood in urine  Neurological: Negative.   Psychiatric/Behavioral: Negative.   All other systems reviewed and are negative.      Objective:   Physical Exam  Constitutional: She is oriented to person, place, and time. She appears well-developed and well-nourished. No distress.  Cardiovascular: Normal rate.   Pulmonary/Chest: Effort normal and breath sounds normal.  Genitourinary:  Genitourinary Comments: No CVA tenderness  Neurological: She is alert and oriented to person, place, and time.  Skin: Skin is warm.  Psychiatric: She has a normal mood and affect. Her behavior is normal. Judgment and thought content normal.   BP (!) 144/88 (BP Location: Left Arm, Cuff Size: Normal)   Pulse 80   Temp 97.3 F (36.3 C) (Oral)   Ht 5\' 3"  (1.6 m)   Wt 198 lb (89.8 kg)   BMI 35.07 kg/m   Urinalysis was negative KUB- moderate amount of stool right ascending colon-Preliminary reading by Paulene FloorMary Deaunna Olarte, FNP  [facility]      Assessment & Plan:  1. Right flank pain toradol injection 60mg  now Force fluids - Urinalysis, Complete - DG Abd 1 View; Future  2. Constipation, unspecified constipation type miralax OTC  Force fluids RTO prn  Tammie Daphine DeutscherMartin, FNP

## 2016-04-22 LAB — URINALYSIS, COMPLETE
Bilirubin, UA: NEGATIVE
GLUCOSE, UA: NEGATIVE
KETONES UA: NEGATIVE
LEUKOCYTES UA: NEGATIVE
NITRITE UA: NEGATIVE
PROTEIN UA: NEGATIVE
RBC, UA: NEGATIVE
SPEC GRAV UA: 1.02 (ref 1.005–1.030)
Urobilinogen, Ur: 1 mg/dL (ref 0.2–1.0)
pH, UA: 8.5 — ABNORMAL HIGH (ref 5.0–7.5)

## 2016-04-22 LAB — MICROSCOPIC EXAMINATION
BACTERIA UA: NONE SEEN
RBC, UA: NONE SEEN /hpf (ref 0–?)

## 2016-04-29 ENCOUNTER — Ambulatory Visit (INDEPENDENT_AMBULATORY_CARE_PROVIDER_SITE_OTHER): Payer: BLUE CROSS/BLUE SHIELD

## 2016-04-29 ENCOUNTER — Ambulatory Visit (INDEPENDENT_AMBULATORY_CARE_PROVIDER_SITE_OTHER): Payer: BLUE CROSS/BLUE SHIELD | Admitting: Family

## 2016-04-29 ENCOUNTER — Encounter: Payer: Self-pay | Admitting: Family

## 2016-04-29 VITALS — BP 134/86 | HR 74 | Temp 97.4°F | Ht 63.0 in | Wt 195.4 lb

## 2016-04-29 DIAGNOSIS — R1011 Right upper quadrant pain: Secondary | ICD-10-CM | POA: Diagnosis not present

## 2016-04-29 DIAGNOSIS — Z87442 Personal history of urinary calculi: Secondary | ICD-10-CM | POA: Diagnosis not present

## 2016-04-29 DIAGNOSIS — R1010 Upper abdominal pain, unspecified: Secondary | ICD-10-CM

## 2016-04-29 DIAGNOSIS — Z79899 Other long term (current) drug therapy: Secondary | ICD-10-CM | POA: Diagnosis not present

## 2016-04-29 DIAGNOSIS — I10 Essential (primary) hypertension: Secondary | ICD-10-CM | POA: Diagnosis not present

## 2016-04-29 DIAGNOSIS — K760 Fatty (change of) liver, not elsewhere classified: Secondary | ICD-10-CM | POA: Diagnosis not present

## 2016-04-29 DIAGNOSIS — N23 Unspecified renal colic: Secondary | ICD-10-CM | POA: Diagnosis not present

## 2016-04-29 DIAGNOSIS — N3001 Acute cystitis with hematuria: Secondary | ICD-10-CM | POA: Diagnosis not present

## 2016-04-29 DIAGNOSIS — F17200 Nicotine dependence, unspecified, uncomplicated: Secondary | ICD-10-CM | POA: Diagnosis not present

## 2016-04-29 DIAGNOSIS — R51 Headache: Secondary | ICD-10-CM | POA: Diagnosis not present

## 2016-04-29 DIAGNOSIS — R1012 Left upper quadrant pain: Secondary | ICD-10-CM | POA: Diagnosis not present

## 2016-04-29 MED ORDER — OMEPRAZOLE 40 MG PO CPDR: 40.0000 mg | DELAYED_RELEASE_CAPSULE | Freq: Every day | ORAL | 3 refills | Status: DC

## 2016-04-29 NOTE — Progress Notes (Signed)
   Subjective:    Patient ID: Tammie Bradshaw, female    DOB: [birth year], 40 y.o.   MRN: [id number]  Abdominal Pain  This is a new problem. The current episode started in the past 7 days (Friday). The onset quality is gradual. The problem occurs constantly. The problem has been gradually worsening. The pain is located in the RUQ and LUQ. The pain is at a severity of 7/10. The pain is moderate. Quality: throbing. The abdominal pain radiates to the back. Associated symptoms include belching. Pertinent negatives include no constipation, diarrhea, dysuria, frequency, headaches, hematuria, nausea or vomiting. The pain is aggravated by eating. The pain is relieved by nothing. She has tried acetaminophen for the symptoms. The treatment provided no relief.      Review of Systems  Gastrointestinal: Positive for abdominal pain. Negative for constipation, diarrhea, nausea and vomiting.  Genitourinary: Negative for dysuria, frequency and hematuria.  Neurological: Negative for headaches.  All other systems reviewed and are negative.      Objective:   Physical Exam  Constitutional: She is oriented to person, place, and time. She appears well-developed and well-nourished. No distress.  HENT:  Head: Normocephalic.  Eyes: Pupils are equal, round, and reactive to light.  Neck: Normal range of motion. Neck supple. No thyromegaly present.  Cardiovascular: Normal rate, regular rhythm, normal heart sounds and intact distal pulses.   No murmur heard. Pulmonary/Chest: Effort normal and breath sounds normal. No respiratory distress. She has no wheezes.  Abdominal: Soft. Bowel sounds are normal. She exhibits no distension. There is tenderness (mildly LUQ).  Musculoskeletal: Normal range of motion. She exhibits no edema or tenderness.  Neurological: She is alert and oriented to person, place, and time.  Skin: Skin is warm and dry.  Psychiatric: She has a normal mood and affect. Her behavior is normal. Judgment  and thought content normal.  Vitals reviewed.   BP (!) 145/85   Pulse 75   Temp 97.4 F (36.3 C) (Oral)   Ht 5\' 3"  (1.6 m)   Wt 195 lb 6.4 oz (88.6 kg)   LMP 04/29/2016   BMI 34.61 kg/m      Assessment & Plan:  1. Pain of upper abdomen -Discussed I believe this is PUD, avoid spicy foods -No NSAID's, pt to take tyelnol  -Discussed if pain worsens to go to ED -Prilosec increased to 40 mg from 20 mg  - CBC with Differential/Platelet - H Pylori, IGM, IGG, IGA AB - DG Abd 1 View; Future - Amylase - Lipase - omeprazole (PRILOSEC) 40 MG capsule; Take 1 capsule (40 mg total) by mouth daily.  Dispense: 30 capsule; Refill: 3  Jannifer Rodneyhristy Drayk Humbarger, Tammie Bradshaw

## 2016-04-29 NOTE — Patient Instructions (Signed)
Peptic Ulcer ° °A peptic ulcer is a sore in the lining of your esophagus (esophageal ulcer), stomach (gastric ulcer), or in the first part of your small intestine (duodenal ulcer). The ulcer causes erosion into the deeper tissue. °CAUSES  °Normally, the lining of the stomach and the small intestine protects itself from the acid that digests food. The protective lining can be damaged by: °· An infection caused by a bacterium called Helicobacter pylori (H. pylori). °· Regular use of nonsteroidal anti-inflammatory drugs (NSAIDs), such as ibuprofen or aspirin. °· Smoking tobacco. °Other risk factors include being older than 50, drinking alcohol excessively, and having a family history of ulcer disease.  °SYMPTOMS  °· Burning pain or gnawing in the area between the chest and the belly button. °· Heartburn. °· Nausea and vomiting. °· Bloating. °The pain can be worse on an empty stomach and at night. If the ulcer results in bleeding, it can cause: °· Black, tarry stools. °· Vomiting of bright red blood. °· Vomiting of coffee-ground-looking materials. °DIAGNOSIS  °A diagnosis is usually made based upon your history and an exam. Other tests and procedures may be performed to find the cause of the ulcer. Finding a cause will help determine the best treatment. Tests and procedures may include: °· Blood tests, stool tests, or breath tests to check for the bacterium H. pylori. °· An upper gastrointestinal (GI) series of the esophagus, stomach, and small intestine. °· An endoscopy to examine the esophagus, stomach, and small intestine. °· A biopsy. °TREATMENT  °Treatment may include: °· Eliminating the cause of the ulcer, such as smoking, NSAIDs, or alcohol. °· Medicines to reduce the amount of acid in your digestive tract. °· Antibiotic medicines if the ulcer is caused by the H. pylori bacterium. °· An upper endoscopy to treat a bleeding ulcer. °· Surgery if the bleeding is severe or if the ulcer created a hole somewhere in the  digestive system. °HOME CARE INSTRUCTIONS  °· Avoid tobacco, alcohol, and caffeine. Smoking can increase the acid in the stomach, and continued smoking will impair the healing of ulcers. °· Avoid foods and drinks that seem to cause discomfort or aggravate your ulcer. °· Only take medicines as directed by your caregiver. Do not substitute over-the-counter medicines for prescription medicines without talking to your caregiver. °· Keep any follow-up appointments and tests as directed. °SEEK MEDICAL CARE IF:  °· Your do not improve within 7 days of starting treatment. °· You have ongoing indigestion or heartburn. °SEEK IMMEDIATE MEDICAL CARE IF:  °· You have sudden, sharp, or persistent abdominal pain. °· You have bloody or dark black, tarry stools. °· You vomit blood or vomit that looks like coffee grounds. °· You become light-headed, weak, or feel faint. °· You become sweaty or clammy. °MAKE SURE YOU:  °· Understand these instructions. °· Will watch your condition. °· Will get help right away if you are not doing well or get worse. °  °This information is not intended to replace advice given to you by your health care provider. Make sure you discuss any questions you have with your health care provider. °  °Document Released: 07/12/2000 Document Revised: 08/05/2014 Document Reviewed: 02/12/2012 °Elsevier Interactive Patient Education ©2016 Elsevier Inc. ° °

## 2016-04-30 DIAGNOSIS — R1013 Epigastric pain: Secondary | ICD-10-CM | POA: Diagnosis not present

## 2016-04-30 DIAGNOSIS — R1011 Right upper quadrant pain: Secondary | ICD-10-CM | POA: Diagnosis not present

## 2016-04-30 LAB — CBC WITH DIFFERENTIAL/PLATELET
BASOS ABS: 0.1 10*3/uL (ref 0.0–0.2)
Basos: 1 %
EOS (ABSOLUTE): 0.3 10*3/uL (ref 0.0–0.4)
Eos: 3 %
HEMOGLOBIN: 10.2 g/dL — AB (ref 11.1–15.9)
Hematocrit: 32.3 % — ABNORMAL LOW (ref 34.0–46.6)
IMMATURE GRANS (ABS): 0 10*3/uL (ref 0.0–0.1)
IMMATURE GRANULOCYTES: 0 %
LYMPHS: 36 %
Lymphocytes Absolute: 3.6 10*3/uL — ABNORMAL HIGH (ref 0.7–3.1)
MCH: 23.7 pg — ABNORMAL LOW (ref 26.6–33.0)
MCHC: 31.6 g/dL (ref 31.5–35.7)
MCV: 75 fL — ABNORMAL LOW (ref 79–97)
MONOCYTES: 5 %
Monocytes Absolute: 0.5 10*3/uL (ref 0.1–0.9)
NEUTROS PCT: 55 %
Neutrophils Absolute: 5.3 10*3/uL (ref 1.4–7.0)
PLATELETS: 464 10*3/uL — AB (ref 150–379)
RBC: 4.3 x10E6/uL (ref 3.77–5.28)
RDW: 17.5 % — ABNORMAL HIGH (ref 12.3–15.4)
WBC: 9.8 10*3/uL (ref 3.4–10.8)

## 2016-04-30 LAB — H PYLORI, IGM, IGG, IGA AB

## 2016-04-30 LAB — AMYLASE: AMYLASE: 30 U/L — AB (ref 31–124)

## 2016-04-30 LAB — LIPASE: LIPASE: 21 U/L (ref 14–72)

## 2016-05-09 DIAGNOSIS — N3001 Acute cystitis with hematuria: Secondary | ICD-10-CM | POA: Diagnosis not present

## 2016-05-09 DIAGNOSIS — F17200 Nicotine dependence, unspecified, uncomplicated: Secondary | ICD-10-CM | POA: Diagnosis not present

## 2016-05-09 DIAGNOSIS — K219 Gastro-esophageal reflux disease without esophagitis: Secondary | ICD-10-CM | POA: Diagnosis not present

## 2016-05-09 DIAGNOSIS — I10 Essential (primary) hypertension: Secondary | ICD-10-CM | POA: Diagnosis not present

## 2016-05-09 DIAGNOSIS — Z79899 Other long term (current) drug therapy: Secondary | ICD-10-CM | POA: Diagnosis not present

## 2016-05-09 DIAGNOSIS — Z720 Tobacco use: Secondary | ICD-10-CM | POA: Diagnosis not present

## 2016-05-10 ENCOUNTER — Ambulatory Visit: Payer: BLUE CROSS/BLUE SHIELD | Admitting: Family Medicine

## 2016-05-13 ENCOUNTER — Encounter: Payer: Self-pay | Admitting: Family

## 2016-08-26 DIAGNOSIS — F17210 Nicotine dependence, cigarettes, uncomplicated: Secondary | ICD-10-CM | POA: Diagnosis not present

## 2016-08-26 DIAGNOSIS — R0789 Other chest pain: Secondary | ICD-10-CM | POA: Diagnosis not present

## 2016-08-26 DIAGNOSIS — Z79899 Other long term (current) drug therapy: Secondary | ICD-10-CM | POA: Diagnosis not present

## 2016-08-26 DIAGNOSIS — F419 Anxiety disorder, unspecified: Secondary | ICD-10-CM | POA: Diagnosis not present

## 2016-08-26 DIAGNOSIS — I10 Essential (primary) hypertension: Secondary | ICD-10-CM | POA: Diagnosis not present

## 2016-08-26 DIAGNOSIS — R202 Paresthesia of skin: Secondary | ICD-10-CM | POA: Diagnosis not present

## 2016-08-26 DIAGNOSIS — Z8249 Family history of ischemic heart disease and other diseases of the circulatory system: Secondary | ICD-10-CM | POA: Diagnosis not present

## 2016-08-26 DIAGNOSIS — R079 Chest pain, unspecified: Secondary | ICD-10-CM | POA: Diagnosis not present

## 2016-08-26 DIAGNOSIS — K219 Gastro-esophageal reflux disease without esophagitis: Secondary | ICD-10-CM | POA: Diagnosis not present

## 2016-08-29 ENCOUNTER — Ambulatory Visit (INDEPENDENT_AMBULATORY_CARE_PROVIDER_SITE_OTHER): Payer: BLUE CROSS/BLUE SHIELD | Admitting: Family

## 2016-08-29 ENCOUNTER — Encounter: Payer: Self-pay | Admitting: Family

## 2016-08-29 VITALS — BP 139/81 | HR 75 | Temp 97.4°F | Ht 63.0 in | Wt 196.4 lb

## 2016-08-29 DIAGNOSIS — F411 Generalized anxiety disorder: Secondary | ICD-10-CM | POA: Diagnosis not present

## 2016-08-29 DIAGNOSIS — Z09 Encounter for follow-up examination after completed treatment for conditions other than malignant neoplasm: Secondary | ICD-10-CM | POA: Diagnosis not present

## 2016-08-29 MED ORDER — CITALOPRAM HYDROBROMIDE 20 MG PO TABS: 20.0000 mg | ORAL_TABLET | Freq: Every day | ORAL | 1 refills | Status: DC

## 2016-08-29 NOTE — Progress Notes (Signed)
   Subjective:    Patient ID: Tammie Bradshaw, female    DOB: 07-18-1976, 41 y.o.   MRN: [id number]  Pt presents to the office today for hospital follow up for a panic attack. PT states she went to the ED on 08/26/16 and was diagnosed with GAD and panic attack. Pt states this was her first panic attack and states over the last month she has been under a great deal of stress. She states her daughter ran away and is now in the hospital for suicide thoughts. PT states the ED gave her clonazepam 0.5 mg daily as needed.  Anxiety  Presents for initial visit. Onset was 6 to 12 months ago. The problem has been rapidly worsening. Symptoms include decreased concentration, depressed mood, excessive worry, irritability, nervous/anxious behavior, palpitations, panic and restlessness. Patient reports no suicidal ideas. Symptoms occur constantly. The severity of symptoms is moderate. The symptoms are aggravated by caffeine and family issues. The quality of sleep is fair.   Risk factors include family history. Her past medical history is significant for anxiety/panic attacks.    [facility]*Hospital notes reviewed.   Review of Systems  Constitutional: Positive for irritability.  Cardiovascular: Positive for palpitations.  Psychiatric/Behavioral: Positive for decreased concentration. Negative for suicidal ideas. The patient is nervous/anxious.   All other systems reviewed and are negative.      Objective:   Physical Exam  Constitutional: She is oriented to person, place, and time. She appears well-developed and well-nourished. No distress.  HENT:  Head: Normocephalic and atraumatic.  Right Ear: External ear normal.  Left Ear: External ear normal.  Nose: Nose normal.  Mouth/Throat: Oropharynx is clear and moist.  Eyes: Pupils are equal, round, and reactive to light.  Neck: Normal range of motion. Neck supple. No thyromegaly present.  Cardiovascular: Normal rate, regular rhythm, normal heart sounds and intact  distal pulses.   No murmur heard. Pulmonary/Chest: Effort normal and breath sounds normal. No respiratory distress. She has no wheezes.  Abdominal: Soft. Bowel sounds are normal. She exhibits no distension. There is no tenderness.  Musculoskeletal: Normal range of motion. She exhibits no edema or tenderness.  Neurological: She is alert and oriented to person, place, and time.  Skin: Skin is warm and dry.  Psychiatric: She has a normal mood and affect. Her behavior is normal. Judgment and thought content normal.  Vitals reviewed.    BP 139/81   Pulse 75   Temp 97.4 F (36.3 C) (Oral)   Ht 5\' 3"  (1.6 m)   Wt 196 lb 6.4 oz (89.1 kg)   BMI 34.79 kg/m       Assessment & Plan:  1. GAD (generalized anxiety disorder) -Pt started on Celexa today Stress management discussed RTO in 4-6 weeks - citalopram (CELEXA) 20 MG tablet; Take 1 tablet (20 mg total) by mouth daily.  Dispense: 90 tablet; Refill: 1  2. Hospital discharge follow-up -Notes reviewed and scanned into chart   Jannifer Rodneyhristy Joban Colledge, FNP

## 2016-08-29 NOTE — Patient Instructions (Signed)
Stress and Stress Management Stress is a normal reaction to life events. It is what you feel when life demands more than you are used to or more than you can handle. Some stress can be useful. For example, the stress reaction can help you catch the last bus of the day, study for a test, or meet a deadline at work. But stress that occurs too often or for too long can cause problems. It can affect your emotional health and interfere with relationships and normal daily activities. Too much stress can weaken your immune system and increase your risk for physical illness. If you already have a medical problem, stress can make it worse. What are the causes? All sorts of life events may cause stress. An event that causes stress for one person may not be stressful for another person. Major life events commonly cause stress. These may be positive or negative. Examples include losing your job, moving into a new home, getting married, having a baby, or losing a loved one. Less obvious life events may also cause stress, especially if they occur day after day or in combination. Examples include working long hours, driving in traffic, caring for children, being in debt, or being in a difficult relationship. What are the signs or symptoms? Stress may cause emotional symptoms including, the following:  Anxiety. This is feeling worried, afraid, on edge, overwhelmed, or out of control.  Anger. This is feeling irritated or impatient.  Depression. This is feeling sad, down, helpless, or guilty.  Difficulty focusing, remembering, or making decisions. Stress may cause physical symptoms, including the following:  Aches and pains. These may affect your head, neck, back, stomach, or other areas of your body.  Tight muscles or clenched jaw.  Low energy or trouble sleeping. Stress may cause unhealthy behaviors, including the following:  Eating to feel better (overeating) or skipping meals.  Sleeping too little, too  much, or both.  Working too much or putting off tasks (procrastination).  Smoking, drinking alcohol, or using drugs to feel better. How is this diagnosed? Stress is diagnosed through an assessment by your health care provider. Your health care provider will ask questions about your symptoms and any stressful life events.Your health care provider will also ask about your medical history and may order blood tests or other tests. Certain medical conditions and medicine can cause physical symptoms similar to stress. Mental illness can cause emotional symptoms and unhealthy behaviors similar to stress. Your health care provider may refer you to a mental health professional for further evaluation. How is this treated? Stress management is the recommended treatment for stress.The goals of stress management are reducing stressful life events and coping with stress in healthy ways. Techniques for reducing stressful life events include the following:  Stress identification. Self-monitor for stress and identify what causes stress for you. These skills may help you to avoid some stressful events.  Time management. Set your priorities, keep a calendar of events, and learn to say "no." These tools can help you avoid making too many commitments. Techniques for coping with stress include the following:  Rethinking the problem. Try to think realistically about stressful events rather than ignoring them or overreacting. Try to find the positives in a stressful situation rather than focusing on the negatives.  Exercise. Physical exercise can release both physical and emotional tension. The key is to find a form of exercise you enjoy and do it regularly.  Relaxation techniques. These relax the body and mind. Examples include yoga,  meditation, tai chi, biofeedback, deep breathing, progressive muscle relaxation, listening to music, being out in nature, journaling, and other hobbies. Again, the key is to find one or  more that you enjoy and can do regularly.  Healthy lifestyle. Eat a balanced diet, get plenty of sleep, and do not smoke. Avoid using alcohol or drugs to relax.  Strong support network. Spend time with family, friends, or other people you enjoy being around.Express your feelings and talk things over with someone you trust. Counseling or talktherapy with a mental health professional may be helpful if you are having difficulty managing stress on your own. Medicine is typically not recommended for the treatment of stress.Talk to your health care provider if you think you need medicine for symptoms of stress. Follow these instructions at home:  Keep all follow-up visits as directed by your health care provider.  Take all medicines as directed by your health care provider. Contact a health care provider if:  Your symptoms get worse or you start having new symptoms.  You feel overwhelmed by your problems and can no longer manage them on your own. Get help right away if:  You feel like hurting yourself or someone else. This information is not intended to replace advice given to you by your health care provider. Make sure you discuss any questions you have with your health care provider. Document Released: 01/08/2001 Document Revised: 12/21/2015 Document Reviewed: 03/09/2013 Elsevier Interactive Patient Education  2017 [employer].

## 2016-09-24 ENCOUNTER — Encounter: Payer: Self-pay | Admitting: Family

## 2016-09-24 ENCOUNTER — Ambulatory Visit (INDEPENDENT_AMBULATORY_CARE_PROVIDER_SITE_OTHER): Payer: BLUE CROSS/BLUE SHIELD | Admitting: Family

## 2016-09-24 VITALS — BP 140/93 | HR 81 | Temp 97.0°F | Ht 63.0 in | Wt 195.8 lb

## 2016-09-24 DIAGNOSIS — M7552 Bursitis of left shoulder: Secondary | ICD-10-CM | POA: Diagnosis not present

## 2016-09-24 MED ORDER — PREDNISONE 10 MG (21) PO TBPK: ORAL_TABLET | ORAL | 0 refills | Status: DC

## 2016-09-24 MED ORDER — NAPROXEN 500 MG PO TABS: 500.0000 mg | ORAL_TABLET | Freq: Two times a day (BID) | ORAL | 1 refills | Status: DC

## 2016-09-24 NOTE — Progress Notes (Signed)
   Subjective:    Patient [facility]    DOB: 03-Feb-1976, 41 y.o.   MRN: [id number]  Arm Pain   The incident occurred 12 to 24 hours ago. There was no injury mechanism. The pain is present in the upper left arm. The quality of the pain is described as aching. The pain is at a severity of 9/10. The pain is moderate. The pain has been intermittent since the incident. Pertinent negatives include no numbness or tingling. Nothing aggravates the symptoms. She has tried weight bearing and rest for the symptoms. The treatment provided mild relief.      Review of Systems  Musculoskeletal: Positive for myalgias (left arm).  Neurological: Negative for tingling and numbness.  All other systems reviewed and are negative.      Objective:   Physical Exam  Constitutional: She is oriented to person, place, and time. She appears well-developed and well-nourished.  Cardiovascular: Normal rate, regular rhythm, normal heart sounds and intact distal pulses.   Pulmonary/Chest: Effort normal and breath sounds normal.  Musculoskeletal: She exhibits edema and tenderness.  Pt unable to move left arm related to pain, pain with any amount of palpation of upper or lower arm.  Neurological: She is alert and oriented to person, place, and time.      BP (!) 140/93 (BP Location: Right Arm, Patient Position: Sitting, Cuff Size: Normal)   Pulse 81   Temp 97 F (36.1 C) (Oral)   Ht 5\' 3"  (1.6 m)   Wt 195 lb 12.8 oz (88.8 kg)   LMP 09/10/2016 (Approximate)   BMI 34.68 kg/m      Assessment & Plan:  1. Bursitis of left shoulder -Will treat with NSAIDS and steriods -On physical exam, I am unsure if pt's pain accurate, Pt could not move arm or would grimace with pain with any amount of palpation to any area of arm. It is hard to pinpoint exact area of pain.  -RTO if symptoms do not improve or worsen - naproxen (NAPROSYN) 500 MG tablet; Take 1 tablet (500 mg total) by mouth 2 (two) times daily with  a meal.  Dispense: 60 tablet; Refill: 1 - predniSONE (STERAPRED UNI-PAK 21 TAB) 10 MG (21) TBPK tablet; Use as directed  Dispense: 21 tablet; Refill: 0  Jannifer Rodneyhristy Hawks, FNP

## 2016-09-24 NOTE — Patient Instructions (Signed)
Bursitis Bursitis is inflammation and irritation of a bursa, which is one of the small, fluid-filled sacs that cushion and protect the moving parts of your body. These sacs are located between bones and muscles, muscle attachments, or skin areas next to bones. A bursa protects these structures from the wear and tear that results from frequent movement. An inflamed bursa causes pain and swelling. Fluid may build up inside the sac. Bursitis is most common near joints, especially the knees, elbows, hips, and shoulders. What are the causes? Bursitis can be caused by:  Injury from:  A direct blow, like falling on your knee or elbow.  Overuse of a joint (repetitive stress).  Infection. This can happen if bacteria gets into a bursa through a cut or scrape near a joint.  Diseases that cause joint inflammation, such as gout and rheumatoid arthritis. What increases the risk? You may be at risk for bursitis if you:  Have a job or hobby that involves a lot of repetitive stress on your joints.  Have a condition that weakens your body's defense system (immune system), such as diabetes, cancer, or HIV.  Lift and reach overhead often.  Kneel or lean on hard surfaces often.  Run or walk often. What are the signs or symptoms? The most common signs and symptoms of bursitis are:  Pain that gets worse when you move the affected body part or put weight on it.  Inflammation.  Stiffness. Other signs and symptoms may include:  Redness.  Tenderness.  Warmth.  Pain that continues after rest.  Fever and chills. This may occur in bursitis caused by infection. How is this diagnosed? Bursitis may be diagnosed by:  Medical history and physical exam.  MRI.  A procedure to drain fluid from the bursa with a needle (aspiration). The fluid may be checked for signs of infection or gout.  Blood tests to rule out other causes of inflammation. How is this treated? Bursitis can usually be treated at  home with rest, ice, compression, and elevation (RICE). For mild bursitis, RICE treatment may be all you need. Other treatments may include:  Nonsteroidal anti-inflammatory drugs (NSAIDs) to treat pain and inflammation.  Corticosteroids to fight inflammation. You may have these drugs injected into and around the area of bursitis.  Aspiration of bursitis fluid to relieve pain and improve movement.  Antibiotic medicine to treat an infected bursa.  A splint, brace, or walking aid.  Physical therapy if you continue to have pain or limited movement.  Surgery to remove a damaged or infected bursa. This may be needed if you have a very bad case of bursitis or if other treatments have not worked. Follow these instructions at home:  Take medicines only as directed by your health care provider.  If you were prescribed an antibiotic medicine, finish it all even if you start to feel better.  Rest the affected area as directed by your health care provider.  Keep the area elevated.  Avoid activities that make pain worse.  Apply ice to the injured area:  Place ice in a plastic bag.  Place a towel between your skin and the bag.  Leave the ice on for 20 minutes, 2-3 times a day.  Use splints, braces, pads, or walking aids as directed by your health care provider.  Keep all follow-up visits as directed by your health care provider. This is important. How is this prevented?  Wear knee pads if you kneel often.  Wear sturdy running or walking shoes   that fit you well.  Take regular breaks from repetitive activity.  Warm up by stretching before doing any strenuous activity.  Maintain a healthy weight or lose weight as recommended by your health care provider. Ask your health care provider if you need help.  Exercise regularly. Start any new physical activity gradually. Contact a health care provider if:  Your bursitis is not responding to treatment or home care.  You have a  fever.  You have chills. This information is not intended to replace advice given to you by your health care provider. Make sure you discuss any questions you have with your health care provider. Document Released: 07/12/2000 Document Revised: 12/21/2015 Document Reviewed: 10/04/2013 Elsevier Interactive Patient Education  2017 [employer]  

## 2016-09-25 DIAGNOSIS — F17200 Nicotine dependence, unspecified, uncomplicated: Secondary | ICD-10-CM | POA: Diagnosis not present

## 2016-09-25 DIAGNOSIS — I10 Essential (primary) hypertension: Secondary | ICD-10-CM | POA: Diagnosis not present

## 2016-09-25 DIAGNOSIS — M7552 Bursitis of left shoulder: Secondary | ICD-10-CM | POA: Diagnosis not present

## 2016-09-25 DIAGNOSIS — Z79899 Other long term (current) drug therapy: Secondary | ICD-10-CM | POA: Diagnosis not present

## 2016-09-25 DIAGNOSIS — M19012 Primary osteoarthritis, left shoulder: Secondary | ICD-10-CM | POA: Diagnosis not present

## 2016-09-25 DIAGNOSIS — M25512 Pain in left shoulder: Secondary | ICD-10-CM | POA: Diagnosis not present

## 2016-09-26 ENCOUNTER — Ambulatory Visit: Payer: BLUE CROSS/BLUE SHIELD | Admitting: Family

## 2016-09-26 ENCOUNTER — Encounter: Payer: Self-pay | Admitting: Family

## 2016-09-26 ENCOUNTER — Ambulatory Visit (INDEPENDENT_AMBULATORY_CARE_PROVIDER_SITE_OTHER): Payer: BLUE CROSS/BLUE SHIELD | Admitting: Family

## 2016-09-26 VITALS — BP 159/93 | HR 93 | Temp 97.8°F | Ht 63.0 in | Wt 197.4 lb

## 2016-09-26 DIAGNOSIS — Z23 Encounter for immunization: Secondary | ICD-10-CM | POA: Diagnosis not present

## 2016-09-26 DIAGNOSIS — D509 Iron deficiency anemia, unspecified: Secondary | ICD-10-CM | POA: Diagnosis not present

## 2016-09-26 DIAGNOSIS — E559 Vitamin D deficiency, unspecified: Secondary | ICD-10-CM | POA: Diagnosis not present

## 2016-09-26 DIAGNOSIS — E785 Hyperlipidemia, unspecified: Secondary | ICD-10-CM

## 2016-09-26 DIAGNOSIS — Z0000 Encounter for general adult medical examination without abnormal findings: Secondary | ICD-10-CM

## 2016-09-26 DIAGNOSIS — Z01411 Encounter for gynecological examination (general) (routine) with abnormal findings: Secondary | ICD-10-CM | POA: Diagnosis not present

## 2016-09-26 DIAGNOSIS — F411 Generalized anxiety disorder: Secondary | ICD-10-CM

## 2016-09-26 DIAGNOSIS — K219 Gastro-esophageal reflux disease without esophagitis: Secondary | ICD-10-CM

## 2016-09-26 DIAGNOSIS — M79602 Pain in left arm: Secondary | ICD-10-CM

## 2016-09-26 NOTE — Progress Notes (Addendum)
Subjective:    Patient ID: Tammie Bradshaw, female    DOB: 1975-09-04, 41 y.o.   MRN: [id number]  Pt presents to the office today for CPE with pap. Pt continues to have left arm pain. PT went to the ED on 09/25/16 and had a negative x-ray.  Gynecologic Exam  The patient's pertinent negatives include no genital lesions or genital odor. This is a chronic problem. The patient is experiencing no pain. Pertinent negatives include no abdominal pain, constipation or painful intercourse.  Hyperlipidemia  This is a chronic problem. The current episode started more than 1 year ago. The problem is uncontrolled. Recent lipid tests were reviewed and are high. Exacerbating diseases include obesity. She is currently on no antihyperlipidemic treatment. The current treatment provides no improvement of lipids. Risk factors for coronary artery disease include dyslipidemia, obesity, post-menopausal and a sedentary lifestyle.  Anxiety  Presents for follow-up visit. Symptoms include excessive worry, irritability, nervous/anxious behavior and restlessness. Patient reports no depressed mood. Symptoms occur most days.   Her past medical history is significant for anemia.  Anemia  Presents for follow-up visit. Symptoms include malaise/fatigue. There has been no abdominal pain.  Arm Pain   The incident occurred more than 1 week ago. There was no injury mechanism. The pain is present in the left shoulder. The quality of the pain is described as aching. The pain is at a severity of 8/10. The pain is moderate. The pain has been intermittent since the incident. Pertinent negatives include no numbness or tingling. Nothing aggravates the symptoms. She has tried NSAIDs and rest for the symptoms. The treatment provided mild relief.  Gastroesophageal Reflux  She complains of heartburn. She reports no abdominal pain, no coughing or no wheezing. This is a chronic problem. The current episode started more than 1 year ago. The problem  occurs occasionally. The problem has been waxing and waning. She has tried a PPI and a diet change for the symptoms. The treatment provided moderate relief.      Review of Systems  Constitutional: Positive for irritability and malaise/fatigue.  Respiratory: Negative for cough and wheezing.   Gastrointestinal: Positive for heartburn. Negative for abdominal pain and constipation.  Neurological: Negative for tingling and numbness.  Psychiatric/Behavioral: The patient is nervous/anxious.   All other systems reviewed and are negative.      Objective:   Physical Exam  Constitutional: She is oriented to person, place, and time. She appears well-developed and well-nourished. No distress.  HENT:  Head: Normocephalic and atraumatic.  Right Ear: External ear normal.  Left Ear: External ear normal.  Nose: Nose normal.  Mouth/Throat: Oropharynx is clear and moist.  Eyes: Pupils are equal, round, and reactive to light.  Neck: Normal range of motion. Neck supple. No thyromegaly present.  Cardiovascular: Normal rate, regular rhythm, normal heart sounds and intact distal pulses.   No murmur heard. Pulmonary/Chest: Effort normal and breath sounds normal. No respiratory distress. She has no wheezes. Right breast exhibits no inverted nipple, no mass, no nipple discharge, no skin change and no tenderness. Left breast exhibits no inverted nipple, no mass, no nipple discharge, no skin change and no tenderness. Breasts are symmetrical.  Abdominal: Soft. Bowel sounds are normal. She exhibits no distension. There is no tenderness.  Genitourinary: Vagina normal.  Genitourinary Comments: Bimanual exam- no adnexal masses or tenderness, ovaries nonpalpable   Cervix parous and pink- No discharge   Musculoskeletal: She exhibits tenderness. She exhibits no edema.  Limited ROM with any movement  of left arm. Tenderness present with any palpation to any area of arm.   Neurological: She is alert and oriented to  person, place, and time.  Skin: Skin is warm and dry.  Psychiatric: She has a normal mood and affect. Her behavior is normal. Judgment and thought content normal.  Vitals reviewed.    BP (!) 159/93   Pulse 93   Temp 97.8 F (36.6 C) (Oral)   Ht 5' 3"  (1.6 m)   Wt 197 lb 6.4 oz (89.5 kg)   LMP 09/10/2016 (Approximate)   BMI 34.97 kg/m      Assessment & Plan:  1. GAD (generalized anxiety disorder) - CMP14+EGFR  2. Hyperlipidemia, unspecified hyperlipidemia type - CMP14+EGFR - Lipid panel  3. Iron deficiency anemia, unspecified iron deficiency anemia type - CMP14+EGFR - Anemia Profile B  4. Vitamin D deficiency - CMP14+EGFR - VITAMIN D 25 Hydroxy (Vit-D Deficiency, Fractures)  5. Gastroesophageal reflux disease, esophagitis presence not specified - CMP14+EGFR  6. Annual physical exam - CMP14+EGFR - Anemia Profile B - Lipid panel - Thyroid Panel With TSH - VITAMIN D 25 Hydroxy (Vit-D Deficiency, Fractures) - Pap IG w/ reflex to HPV when ASC-U  7. Encounter for gynecological examination with abnormal finding - CMP14+EGFR - Pap IG w/ reflex to HPV when ASC-U  8. Left arm pain - Ambulatory referral to Orthopedic Surgery   Continue all meds Labs pending Health Maintenance reviewed-TDAP given today Diet and exercise encouraged RTO 4 months   Evelina Dun, FNP

## 2016-09-26 NOTE — Patient Instructions (Signed)
Health Maintenance, Female Adopting a healthy lifestyle and getting preventive care can go a long way to promote health and wellness. Talk with your health care provider about what schedule of regular examinations is right for you. This is a good chance for you to check in with your provider about disease prevention and staying healthy. In between checkups, there are plenty of things you can do on your own. Experts have done a lot of research about which lifestyle changes and preventive measures are most likely to keep you healthy. Ask your health care provider for more information. Weight and diet Eat a healthy diet  Be sure to include plenty of vegetables, fruits, low-fat dairy products, and lean protein.  Do not eat a lot of foods high in solid fats, added sugars, or salt.  Get regular exercise. This is one of the most important things you can do for your health.  Most adults should exercise for at least 150 minutes each week. The exercise should increase your heart rate and make you sweat (moderate-intensity exercise).  Most adults should also do strengthening exercises at least twice a week. This is in addition to the moderate-intensity exercise. Maintain a healthy weight  Body mass index (BMI) is a measurement that can be used to identify possible weight problems. It estimates body fat based on height and weight. Your health care provider can help determine your BMI and help you achieve or maintain a healthy weight.  For females 76 years of age and older:  A BMI below 18.5 is considered underweight.  A BMI of 18.5 to 24.9 is normal.  A BMI of 25 to 29.9 is considered overweight.  A BMI of 30 and above is considered obese. Watch levels of cholesterol and blood lipids  You should start having your blood tested for lipids and cholesterol at 39 years of age, then have this test every 5 years.  You may need to have your cholesterol levels checked more often if:  Your lipid or  cholesterol levels are high.  You are older than 39 years of age.  You are at high risk for heart disease. Cancer screening Lung Cancer  Lung cancer screening is recommended for adults 64-42 years old who are at high risk for lung cancer because of a history of smoking.  A yearly low-dose CT scan of the lungs is recommended for people who:  Currently smoke.  Have quit within the past 15 years.  Have at least a 30-pack-year history of smoking. A pack year is smoking an average of one pack of cigarettes a day for 1 year.  Yearly screening should continue until it has been 15 years since you quit.  Yearly screening should stop if you develop a health problem that would prevent you from having lung cancer treatment. Breast Cancer  Practice breast self-awareness. This means understanding how your breasts normally appear and feel.  It also means doing regular breast self-exams. Let your health care provider know about any changes, no matter how small.  If you are in your 20s or 30s, you should have a clinical breast exam (CBE) by a health care provider every 1-3 years as part of a regular health exam.  If you are 34 or older, have a CBE every year. Also consider having a breast X-ray (mammogram) every year.  If you have a family history of breast cancer, talk to your health care provider about genetic screening.  If you are at high risk for breast cancer, talk  to your health care provider about having an MRI and a mammogram every year.  Breast cancer gene (BRCA) assessment is recommended for women who have family members with BRCA-related cancers. BRCA-related cancers include:  Breast.  Ovarian.  Tubal.  Peritoneal cancers.  Results of the assessment will determine the need for genetic counseling and BRCA1 and BRCA2 testing. Cervical Cancer  Your health care provider may recommend that you be screened regularly for cancer of the pelvic organs (ovaries, uterus, and vagina).  This screening involves a pelvic examination, including checking for microscopic changes to the surface of your cervix (Pap test). You may be encouraged to have this screening done every 3 years, beginning at age 24.  For women ages 66-65, health care providers may recommend pelvic exams and Pap testing every 3 years, or they may recommend the Pap and pelvic exam, combined with testing for human papilloma virus (HPV), every 5 years. Some types of HPV increase your risk of cervical cancer. Testing for HPV may also be done on women of any age with unclear Pap test results.  Other health care providers may not recommend any screening for nonpregnant women who are considered low risk for pelvic cancer and who do not have symptoms. Ask your health care provider if a screening pelvic exam is right for you.  If you have had past treatment for cervical cancer or a condition that could lead to cancer, you need Pap tests and screening for cancer for at least 20 years after your treatment. If Pap tests have been discontinued, your risk factors (such as having a new sexual partner) need to be reassessed to determine if screening should resume. Some women have medical problems that increase the chance of getting cervical cancer. In these cases, your health care provider may recommend more frequent screening and Pap tests. Colorectal Cancer  This type of cancer can be detected and often prevented.  Routine colorectal cancer screening usually begins at 39 years of age and continues through 39 years of age.  Your health care provider may recommend screening at an earlier age if you have risk factors for colon cancer.  Your health care provider may also recommend using home test kits to check for hidden blood in the stool.  A small camera at the end of a tube can be used to examine your colon directly (sigmoidoscopy or colonoscopy). This is done to check for the earliest forms of colorectal cancer.  Routine  screening usually begins at age 41.  Direct examination of the colon should be repeated every 5-10 years through 39 years of age. However, you may need to be screened more often if early forms of precancerous polyps or small growths are found. Skin Cancer  Check your skin from head to toe regularly.  Tell your health care provider about any new moles or changes in moles, especially if there is a change in a mole's shape or color.  Also tell your health care provider if you have a mole that is larger than the size of a pencil eraser.  Always use sunscreen. Apply sunscreen liberally and repeatedly throughout the day.  Protect yourself by wearing long sleeves, pants, a wide-brimmed hat, and sunglasses whenever you are outside. Heart disease, diabetes, and high blood pressure  High blood pressure causes heart disease and increases the risk of stroke. High blood pressure is more likely to develop in:  People who have blood pressure in the high end of the normal range (130-139/85-89 mm Hg).  People who are overweight or obese.  People who are African American.  If you are 59-24 years of age, have your blood pressure checked every 3-5 years. If you are 34 years of age or older, have your blood pressure checked every year. You should have your blood pressure measured twice-once when you are at a hospital or clinic, and once when you are not at a hospital or clinic. Record the average of the two measurements. To check your blood pressure when you are not at a hospital or clinic, you can use:  An automated blood pressure machine at a pharmacy.  A home blood pressure monitor.  If you are between 29 years and 60 years old, ask your health care provider if you should take aspirin to prevent strokes.  Have regular diabetes screenings. This involves taking a blood sample to check your fasting blood sugar level.  If you are at a normal weight and have a low risk for diabetes, have this test once  every three years after 39 years of age.  If you are overweight and have a high risk for diabetes, consider being tested at a younger age or more often. Preventing infection Hepatitis B  If you have a higher risk for hepatitis B, you should be screened for this virus. You are considered at high risk for hepatitis B if:  You were born in a country where hepatitis B is common. Ask your health care provider which countries are considered high risk.  Your parents were born in a high-risk country, and you have not been immunized against hepatitis B (hepatitis B vaccine).  You have HIV or AIDS.  You use needles to inject street drugs.  You live with someone who has hepatitis B.  You have had sex with someone who has hepatitis B.  You get hemodialysis treatment.  You take certain medicines for conditions, including cancer, organ transplantation, and autoimmune conditions. Hepatitis C  Blood testing is recommended for:  Everyone born from [birth year] through 1963.  Anyone with known risk factors for hepatitis C. Sexually transmitted infections (STIs)  You should be screened for sexually transmitted infections (STIs) including gonorrhea and chlamydia if:  You are sexually active and are younger than 39 years of age.  You are older than 39 years of age and your health care provider tells you that you are at risk for this type of infection.  Your sexual activity has changed since you were last screened and you are at an increased risk for chlamydia or gonorrhea. Ask your health care provider if you are at risk.  If you do not have HIV, but are at risk, it may be recommended that you take a prescription medicine daily to prevent HIV infection. This is called pre-exposure prophylaxis (PrEP). You are considered at risk if:  You are sexually active and do not regularly use condoms or know the HIV status of your partner(s).  You take drugs by injection.  You are sexually active with a partner  who has HIV. Talk with your health care provider about whether you are at high risk of being infected with HIV. If you choose to begin PrEP, you should first be tested for HIV. You should then be tested every 3 months for as long as you are taking PrEP. Pregnancy  If you are premenopausal and you may become pregnant, ask your health care provider about preconception counseling.  If you may become pregnant, take 400 to 800 micrograms (mcg) of folic acid  every day.  If you want to prevent pregnancy, talk to your health care provider about birth control (contraception). Osteoporosis and menopause  Osteoporosis is a disease in which the bones lose minerals and strength with aging. This can result in serious bone fractures. Your risk for osteoporosis can be identified using a bone density scan.  If you are 4 years of age or older, or if you are at risk for osteoporosis and fractures, ask your health care provider if you should be screened.  Ask your health care provider whether you should take a calcium or vitamin D supplement to lower your risk for osteoporosis.  Menopause may have certain physical symptoms and risks.  Hormone replacement therapy may reduce some of these symptoms and risks. Talk to your health care provider about whether hormone replacement therapy is right for you. Follow these instructions at home:  Schedule regular health, dental, and eye exams.  Stay current with your immunizations.  Do not use any tobacco products including cigarettes, chewing tobacco, or electronic cigarettes.  If you are pregnant, do not drink alcohol.  If you are breastfeeding, limit how much and how often you drink alcohol.  Limit alcohol intake to no more than 1 drink per day for nonpregnant women. One drink equals 12 ounces of beer, 5 ounces of wine, or 1 ounces of hard liquor.  Do not use street drugs.  Do not share needles.  Ask your health care provider for help if you need support  or information about quitting drugs.  Tell your health care provider if you often feel depressed.  Tell your health care provider if you have ever been abused or do not feel safe at home. This information is not intended to replace advice given to you by your health care provider. Make sure you discuss any questions you have with your health care provider. Document Released: 01/28/2011 Document Revised: 12/21/2015 Document Reviewed: 04/18/2015 Elsevier Interactive Patient Education  2017 [employer].

## 2016-09-26 NOTE — Addendum Note (Signed)
Addended by: Almeta MonasSTONE, JANIE M on: 09/26/2016 04:30 PM   Modules accepted: Orders

## 2016-09-27 ENCOUNTER — Other Ambulatory Visit: Payer: Self-pay | Admitting: Family

## 2016-09-27 DIAGNOSIS — D509 Iron deficiency anemia, unspecified: Secondary | ICD-10-CM

## 2016-09-27 LAB — CMP14+EGFR
ALK PHOS: 66 IU/L (ref 39–117)
ALT: 12 IU/L (ref 0–32)
AST: 8 IU/L (ref 0–40)
Albumin/Globulin Ratio: 1.4 (ref 1.2–2.2)
Albumin: 4.2 g/dL (ref 3.5–5.5)
BUN/Creatinine Ratio: 23 (ref 9–23)
BUN: 14 mg/dL (ref 6–20)
CHLORIDE: 102 mmol/L (ref 96–106)
CO2: 22 mmol/L (ref 18–29)
Calcium: 9 mg/dL (ref 8.7–10.2)
Creatinine, Ser: 0.62 mg/dL (ref 0.57–1.00)
GFR calc Af Amer: 132 mL/min/{1.73_m2} (ref >59)
GFR calc non Af Amer: 115 mL/min/{1.73_m2} (ref >59)
GLUCOSE: 225 mg/dL — AB (ref 65–99)
Globulin, Total: 2.9 g/dL (ref 1.5–4.5)
Potassium: 4.9 mmol/L (ref 3.5–5.2)
Sodium: 140 mmol/L (ref 134–144)
TOTAL PROTEIN: 7.1 g/dL (ref 6.0–8.5)

## 2016-09-27 LAB — ANEMIA PROFILE B
Basophils Absolute: 0 10*3/uL (ref 0.0–0.2)
Basos: 0 %
EOS (ABSOLUTE): 0 10*3/uL (ref 0.0–0.4)
Eos: 0 %
FERRITIN: 5 ng/mL — AB (ref 15–150)
Folate: 5.9 ng/mL (ref >3.0)
HEMATOCRIT: 32.3 % — AB (ref 34.0–46.6)
HEMOGLOBIN: 9.9 g/dL — AB (ref 11.1–15.9)
IMMATURE GRANS (ABS): 0.1 10*3/uL (ref 0.0–0.1)
IRON: 18 ug/dL — AB (ref 27–159)
Immature Granulocytes: 1 %
Iron Saturation: 5 % — CL (ref 15–55)
Lymphocytes Absolute: 1.7 10*3/uL (ref 0.7–3.1)
Lymphs: 13 %
MCH: 22.6 pg — ABNORMAL LOW (ref 26.6–33.0)
MCHC: 30.7 g/dL — ABNORMAL LOW (ref 31.5–35.7)
MCV: 74 fL — AB (ref 79–97)
MONOCYTES: 4 %
Monocytes Absolute: 0.5 10*3/uL (ref 0.1–0.9)
NEUTROS ABS: 10.7 10*3/uL — AB (ref 1.4–7.0)
Neutrophils: 82 %
Platelets: 474 10*3/uL — ABNORMAL HIGH (ref 150–379)
RBC: 4.38 x10E6/uL (ref 3.77–5.28)
RDW: 18.2 % — ABNORMAL HIGH (ref 12.3–15.4)
RETIC CT PCT: 2 % (ref 0.6–2.6)
TIBC: 394 ug/dL (ref 250–450)
UIBC: 376 ug/dL (ref 131–425)
VITAMIN B 12: 468 pg/mL (ref 232–1245)
WBC: 13.1 10*3/uL — ABNORMAL HIGH (ref 3.4–10.8)

## 2016-09-27 LAB — LIPID PANEL
CHOLESTEROL TOTAL: 186 mg/dL (ref 100–199)
Chol/HDL Ratio: 3.3 ratio units (ref 0.0–4.4)
HDL: 56 mg/dL (ref >39)
LDL CALC: 105 mg/dL — AB (ref 0–99)
Triglycerides: 126 mg/dL (ref 0–149)
VLDL CHOLESTEROL CAL: 25 mg/dL (ref 5–40)

## 2016-09-27 LAB — THYROID PANEL WITH TSH
Free Thyroxine Index: 2 (ref 1.2–4.9)
T3 Uptake Ratio: 27 % (ref 24–39)
T4 TOTAL: 7.5 ug/dL (ref 4.5–12.0)
TSH: 0.69 u[IU]/mL (ref 0.450–4.500)

## 2016-09-27 LAB — VITAMIN D 25 HYDROXY (VIT D DEFICIENCY, FRACTURES): Vit D, 25-Hydroxy: 11.4 ng/mL — ABNORMAL LOW (ref 30.0–100.0)

## 2016-09-27 MED ORDER — VITAMIN D (ERGOCALCIFEROL) 1.25 MG (50000 UNIT) PO CAPS: 50000.0000 [IU] | ORAL_CAPSULE | ORAL | 3 refills | Status: DC

## 2016-09-28 LAB — PAP IG W/ RFLX HPV ASCU: PAP Smear Comment: 0

## 2016-10-24 ENCOUNTER — Ambulatory Visit (HOSPITAL_COMMUNITY): Payer: Self-pay | Admitting: Oncology

## 2016-11-01 ENCOUNTER — Encounter (HOSPITAL_COMMUNITY): Payer: Self-pay | Attending: Oncology

## 2016-11-19 DIAGNOSIS — Z87442 Personal history of urinary calculi: Secondary | ICD-10-CM | POA: Diagnosis not present

## 2016-11-19 DIAGNOSIS — N200 Calculus of kidney: Secondary | ICD-10-CM | POA: Diagnosis not present

## 2016-11-19 DIAGNOSIS — R102 Pelvic and perineal pain: Secondary | ICD-10-CM | POA: Diagnosis not present

## 2016-11-19 DIAGNOSIS — D500 Iron deficiency anemia secondary to blood loss (chronic): Secondary | ICD-10-CM | POA: Diagnosis not present

## 2016-11-19 DIAGNOSIS — K219 Gastro-esophageal reflux disease without esophagitis: Secondary | ICD-10-CM | POA: Diagnosis not present

## 2016-11-19 DIAGNOSIS — Z79899 Other long term (current) drug therapy: Secondary | ICD-10-CM | POA: Diagnosis not present

## 2016-11-19 DIAGNOSIS — R1032 Left lower quadrant pain: Secondary | ICD-10-CM | POA: Diagnosis not present

## 2016-11-19 DIAGNOSIS — I10 Essential (primary) hypertension: Secondary | ICD-10-CM | POA: Diagnosis not present

## 2017-01-02 DIAGNOSIS — N1330 Unspecified hydronephrosis: Secondary | ICD-10-CM | POA: Diagnosis not present

## 2017-01-02 DIAGNOSIS — I10 Essential (primary) hypertension: Secondary | ICD-10-CM | POA: Diagnosis not present

## 2017-01-02 DIAGNOSIS — R109 Unspecified abdominal pain: Secondary | ICD-10-CM | POA: Diagnosis not present

## 2017-01-02 DIAGNOSIS — N200 Calculus of kidney: Secondary | ICD-10-CM | POA: Diagnosis not present

## 2017-01-02 DIAGNOSIS — Z87442 Personal history of urinary calculi: Secondary | ICD-10-CM | POA: Diagnosis not present

## 2017-01-02 DIAGNOSIS — F17200 Nicotine dependence, unspecified, uncomplicated: Secondary | ICD-10-CM | POA: Diagnosis not present

## 2017-01-02 DIAGNOSIS — Z79899 Other long term (current) drug therapy: Secondary | ICD-10-CM | POA: Diagnosis not present

## 2017-01-02 DIAGNOSIS — K219 Gastro-esophageal reflux disease without esophagitis: Secondary | ICD-10-CM | POA: Diagnosis not present

## 2017-01-02 DIAGNOSIS — N132 Hydronephrosis with renal and ureteral calculous obstruction: Secondary | ICD-10-CM | POA: Diagnosis not present

## 2017-01-28 ENCOUNTER — Encounter: Payer: Self-pay | Admitting: *Deleted

## 2017-01-28 ENCOUNTER — Emergency Department (HOSPITAL_COMMUNITY)
Admission: EM | Admit: 2017-01-28 | Discharge: 2017-01-28 | Disposition: A | Discharge status: Home / Self Care | Payer: BLUE CROSS/BLUE SHIELD | Attending: Emergency Medicine | Admitting: Emergency Medicine

## 2017-01-28 ENCOUNTER — Emergency Department (HOSPITAL_COMMUNITY): Payer: BLUE CROSS/BLUE SHIELD

## 2017-01-28 ENCOUNTER — Ambulatory Visit (INDEPENDENT_AMBULATORY_CARE_PROVIDER_SITE_OTHER): Payer: BLUE CROSS/BLUE SHIELD | Admitting: Pediatrics

## 2017-01-28 ENCOUNTER — Ambulatory Visit: Payer: BLUE CROSS/BLUE SHIELD | Admitting: Pediatrics

## 2017-01-28 ENCOUNTER — Encounter: Payer: Self-pay | Admitting: Pediatrics

## 2017-01-28 ENCOUNTER — Encounter (HOSPITAL_COMMUNITY): Payer: Self-pay | Admitting: *Deleted

## 2017-01-28 VITALS — BP 139/88 | HR 73 | Temp 98.2°F | Ht 63.0 in | Wt 199.0 lb

## 2017-01-28 DIAGNOSIS — N393 Stress incontinence (female) (male): Secondary | ICD-10-CM

## 2017-01-28 DIAGNOSIS — M546 Pain in thoracic spine: Secondary | ICD-10-CM

## 2017-01-28 DIAGNOSIS — F17210 Nicotine dependence, cigarettes, uncomplicated: Secondary | ICD-10-CM | POA: Diagnosis not present

## 2017-01-28 DIAGNOSIS — R52 Pain, unspecified: Secondary | ICD-10-CM

## 2017-01-28 DIAGNOSIS — R739 Hyperglycemia, unspecified: Secondary | ICD-10-CM

## 2017-01-28 DIAGNOSIS — M549 Dorsalgia, unspecified: Secondary | ICD-10-CM | POA: Diagnosis not present

## 2017-01-28 LAB — URINALYSIS
Bilirubin, UA: NEGATIVE
Glucose, UA: NEGATIVE
LEUKOCYTES UA: NEGATIVE
NITRITE UA: NEGATIVE
PH UA: 5.5 (ref 5.0–7.5)
RBC, UA: NEGATIVE
Specific Gravity, UA: >1.03 — ABNORMAL HIGH (ref 1.005–1.030)
Urobilinogen, Ur: 0.2 mg/dL (ref 0.2–1.0)

## 2017-01-28 LAB — BAYER DCA HB A1C WAIVED: HB A1C (BAYER DCA - WAIVED): 5.6 % (ref <7.0)

## 2017-01-28 MED ORDER — NAPROXEN 500 MG PO TABS: 500.0000 mg | ORAL_TABLET | Freq: Two times a day (BID) | ORAL | 0 refills | Status: DC

## 2017-01-28 MED ORDER — ACYCLOVIR 800 MG PO TABS: 800.0000 mg | ORAL_TABLET | Freq: Every day | ORAL | 0 refills | Status: DC

## 2017-01-28 MED ORDER — LIDOCAINE 5 % EX PTCH: 1.0000 | MEDICATED_PATCH | CUTANEOUS | 0 refills | Status: DC

## 2017-01-28 MED ORDER — IBUPROFEN 800 MG PO TABS
800.0000 mg | ORAL_TABLET | Freq: Once | ORAL | Status: AC
  Administered 2017-01-28: 800 mg via ORAL
  Filled 2017-01-28: qty 1

## 2017-01-28 NOTE — ED Provider Notes (Signed)
[facility] DEPT Provider Note   CSN: [id number] Arrival date & time: 01/28/17  1058     History   Chief Complaint Chief Complaint  Patient presents with  . Back Pain    HPI Tammie Bradshaw is a 41 y.o. female with no significant History of back pain or injury presenting with mid thoracic burning pain which started 5 days ago.  Her pain is constant at rest but worsened with palpation and movement.  She specifically notices severe pain with palpation and with neck flexion but denies neck pain or radiation of pain.  She denies rash, fevers, chills.  No recent surgical procedures, flulike symptoms.  She denies shortness of breath, chest pain, cough.  She has used Tylenol, icy hot and heating pad with no relief.  The history is provided by the patient.    Past Medical History:  Diagnosis Date  . History of bilateral ligation of fallopian tubes   . Kidney stones     Patient Active Problem List   Diagnosis Date Noted  . GERD (gastroesophageal reflux disease) 09/26/2016  . GAD (generalized anxiety disorder) 08/29/2016  . Hyperlipidemia 05/30/2015  . Vitamin D deficiency 05/30/2015  . Iron deficiency anemia 05/30/2015    Past Surgical History:  Procedure Laterality Date  . LITHOTRIPSY Bilateral   . TUBAL LIGATION      OB History    No data available       Home Medications    Prior to Admission medications   Medication Sig Start Date End Date Taking? Authorizing Provider  acyclovir (ZOVIRAX) 800 MG tablet Take 1 tablet (800 mg total) by mouth 5 (five) times daily. 01/28/17   Burgess AmorIdol, Aniesha Haughn, PA-C  lidocaine (LIDODERM) 5 % Place 1 patch onto the skin daily. Remove & Discard patch within 12 hours or as directed by MD 01/28/17   Burgess AmorIdol, Kyser Wandel, PA-C  naproxen (NAPROSYN) 500 MG tablet Take 1 tablet (500 mg total) by mouth 2 (two) times daily. 01/28/17   Burgess AmorIdol, Latresa Gasser, PA-C  omeprazole (PRILOSEC) 40 MG capsule Take 1 capsule (40 mg total) by mouth daily. 04/29/16   Junie SpencerHawks, Christy A, FNP    Vitamin D, Ergocalciferol, (DRISDOL) 50000 units CAPS capsule Take 1 capsule (50,000 Units total) by mouth every 7 (seven) days. 09/27/16   Junie SpencerHawks, Christy A, FNP    Family History Family History  Problem Relation Age of Onset  . Heart disease Mother     Social History Social History  Substance Use Topics  . Smoking status: Current Every Day Smoker    Packs/day: 0.50  . Smokeless tobacco: Never Used  . Alcohol use No     Allergies   Patient has no known allergies.   Review of Systems Review of Systems  Constitutional: Negative for fever.  Respiratory: Negative for shortness of breath.   Cardiovascular: Negative for chest pain and leg swelling.  Gastrointestinal: Negative for abdominal distention, abdominal pain and constipation.  Genitourinary: Negative for difficulty urinating, dysuria, flank pain, frequency and urgency.  Musculoskeletal: Positive for back pain. Negative for gait problem and joint swelling.  Skin: Negative for rash.  Neurological: Negative for weakness and numbness.     Physical Exam Updated Vital Signs BP (!) 156/83 (BP Location: Right Arm)   Pulse 80   Temp 98.4 F (36.9 C) (Oral)   Resp 18   Ht 5\' 3"  (1.6 m)   Wt 89.8 kg (198 lb)   LMP 01/07/2017   SpO2 100%   BMI 35.07 kg/m  Physical Exam  Constitutional: She appears well-developed and well-nourished.  HENT:  Head: Normocephalic and atraumatic.  Neck: Normal range of motion.  Cardiovascular: Normal rate, regular rhythm, normal heart sounds and intact distal pulses.   Pulmonary/Chest: Effort normal and breath sounds normal. She has no wheezes.  Musculoskeletal: Normal range of motion.  Midline ttp (even light touch) of midline thoracic spine localized at the mid scapula level.  No rash, no erythema or edema.  No c spine or lumbar spine tenderness.  No palpable deformity or spasm.  Neurological: She is alert.  Skin: Skin is warm and dry.  Psychiatric: She has a normal mood and affect.   Nursing note and vitals reviewed.    ED Treatments / Results  Labs (all labs ordered are listed, but only abnormal results are displayed) Labs Reviewed - No data to display  EKG  EKG Interpretation None       Radiology Dg Thoracic Spine W/swimmers  Result Date: 01/28/2017 CLINICAL DATA:  Dorsalgia EXAM: THORACIC SPINE - 3 VIEWS COMPARISON:  None. FINDINGS: Frontal, lateral, and swimmer's views were obtained. There is slight mid thoracic levoscoliosis. No fracture or spondylolisthesis. There are several small anterior and right-sided osteophytes. There is slight disc space narrowing at several levels. No erosive change or paraspinous lesion. 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 Mild scoliosis. No fracture or spondylolisthesis. Electronically Signed   By: Bretta Bang III M.D.   On: 01/28/2017 12:22    Procedures Procedures (including critical care time)  Medications Ordered in ED Medications  ibuprofen (ADVIL,MOTRIN) tablet 800 mg (800 mg Oral Given 01/28/17 1308)     Initial Impression / Assessment and Plan / ED Course  I have reviewed the triage vital signs and the nursing notes.  Pertinent labs & imaging results that were available during my care of the patient were reviewed by me and considered in my medical decision making (see chart for details).     Patient with midline burning thoracic pain with no known injury.  She does have arthritic changes in her T-spine, symptoms probably musculoskeletal/nerve impingement. cannot rule out possible early shingles.  She does not have this history of this but has a sister with significant shingles history and is familiar with this condition.  She was prescribed Naprosyn, advised to continue heat.  May try Lidoderm patch if this does not improve her symptoms.  She is also prescribed acyclovir with strict instructions to hold this prescription and only get filled and start taking if she develops rash at the  site of her pain.  Patient agrees with and understands this plan.  When necessary follow-up with PCP anticipated.  Final Clinical Impressions(s) / ED Diagnoses   Final diagnoses:  Acute midline thoracic back pain    New Prescriptions Discharge Medication List as of 01/28/2017  1:06 PM    START taking these medications   Details  acyclovir (ZOVIRAX) 800 MG tablet Take 1 tablet (800 mg total) by mouth 5 (five) times daily., Starting Tue 01/28/2017, Print    lidocaine (LIDODERM) 5 % Place 1 patch onto the skin daily. Remove & Discard patch within 12 hours or as directed by MD, Starting Tue 01/28/2017, Print    naproxen (NAPROSYN) 500 MG tablet Take 1 tablet (500 mg total) by mouth 2 (two) times daily., Starting Tue 01/28/2017, Print         Burgess Amor, PA-C 01/28/17 1409    Loren Racer, MD 01/30/17 1147

## 2017-01-28 NOTE — Progress Notes (Signed)
  Subjective:   Patient ID: Tammie Bradshaw, female    DOB: [birth year], 41 y.o.   MRN: [id number] CC: Back Pain (upper middle)  HPI: Tammie HurtStacy N Bradshaw is a 41 y.o. female presenting for Back Pain (upper middle)  Woke up 4 days ago with pain in mid spine No known injury Hurts when she looks down or up, feels the pain go straight into the one place in mid back Feels like back "needs to pop" Has had similar pain in lower spine in the past, went away with time No h/o shingles  Has tried icy hot, tylenol. hasnt helped  Seen in ED earlier today, prescribed naproxen Had xrays of thoracic spine with some arthritis, no fractures Given acyclovir in case she developed shingles rash  Has stress incontinence with coughing, laughing sneezing Has had more frequent urination as well recently No burning/dysuria Saw by urologist in the apst who she says told her she has a small bladder and may need dilation  Says she was told she had elevated BGLs in the past  Relevant past medical, surgical, family and social history reviewed. Allergies and medications reviewed and updated. History  Smoking Status  . Current Every Day Smoker  . Packs/day: 0.50  Smokeless Tobacco  . Never Used   ROS: Per HPI   Objective:    BP 139/88   Pulse 73   Temp 98.2 F (36.8 C) (Oral)   Ht 5\' 3"  (1.6 m)   Wt 199 lb (90.3 kg)   LMP 01/07/2017   BMI 35.25 kg/m   Wt Readings from Last 3 Encounters:  01/28/17 199 lb (90.3 kg)  01/28/17 198 lb (89.8 kg)  09/26/16 197 lb 6.4 oz (89.5 kg)    Gen: NAD, alert, cooperative with exam, NCAT EYES: EOMI, no conjunctival injection, or no icterus ENT:  OP without erythema LYMPH: no cervical LAD CV: NRRR, normal S1/S2, no murmur, Resp: CTABL, no wheezes, normal WOB Abd: +BS, soft, NTND. Ext: No edema, warm Neuro: Alert and oriented, strength equal b/l UE and LE, coordination grossly normal MSK: ttp midline mid thoracic spine for apprx 10 cm Minimal pain parasinal  muscles Hurts with gentle touch of spine and surrounding skin in area b/l Normal ROM neck turning head side to side, pain in back with neck flexion Skin: No rash on back Assessment & Plan:  Tammie ArnoldStacy was seen today for back pain.  Diagnoses and all orders for this visit:  Hyperglycemia A1c 5.6 -     Bayer DCA Hb A1c Waived  Acute midline thoracic back pain NSAIDs, rest, ice, heat, can try lidocaine patches as prescribed by ED If not improving rtc, any worsening rtc  Stress incontinence Discussed kegel exercises, regular voiding, weight loss Will check urine today -     Urinalysis   Follow up plan: Return in about 4 weeks (around 02/25/2017) for follow up. Tammie Krasarol Vincent, MD [facility]

## 2017-01-28 NOTE — ED Notes (Signed)
Patient complaining of upper middle back pain. 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

## 2017-01-28 NOTE — Discharge Instructions (Addendum)
Try naproxen and heat to your area of pain.  If this medication does not improve your symptoms, you may add the pain patch prescribed. As discussed, your symptoms are far less likely to be early signs of a shingles outbreak but you have been prescribed medication for this in the event you develop a rash, as discussed.  If no rash develops, this medicine will not help you.  You have been given information about this condition.

## 2017-01-28 NOTE — ED Triage Notes (Signed)
Pt c/o upper back pain that started Friday. Pt has used Tylenol, icy hot and heating pad with no relief. Denies injury.

## 2017-02-14 ENCOUNTER — Encounter: Payer: Self-pay | Admitting: *Deleted

## 2017-02-26 ENCOUNTER — Ambulatory Visit: Payer: BLUE CROSS/BLUE SHIELD | Admitting: Pediatrics

## 2017-02-27 ENCOUNTER — Telehealth: Payer: Self-pay | Admitting: Pediatrics

## 2017-02-27 ENCOUNTER — Encounter: Payer: Self-pay | Admitting: Pediatrics

## 2017-04-01 ENCOUNTER — Ambulatory Visit: Payer: BLUE CROSS/BLUE SHIELD | Admitting: Family

## 2017-04-02 ENCOUNTER — Encounter: Payer: Self-pay | Admitting: Pediatrics

## 2017-04-02 ENCOUNTER — Telehealth: Payer: Self-pay | Admitting: Pediatrics

## 2017-04-02 NOTE — Telephone Encounter (Signed)
New appointment scheduled for 04-10-2017.

## 2017-04-10 ENCOUNTER — Encounter: Payer: Self-pay | Admitting: Family

## 2017-04-10 ENCOUNTER — Ambulatory Visit (INDEPENDENT_AMBULATORY_CARE_PROVIDER_SITE_OTHER): Payer: BLUE CROSS/BLUE SHIELD | Admitting: Family

## 2017-04-10 VITALS — BP 136/87 | HR 73 | Temp 99.4°F | Ht 63.0 in | Wt 199.8 lb

## 2017-04-10 DIAGNOSIS — E785 Hyperlipidemia, unspecified: Secondary | ICD-10-CM

## 2017-04-10 DIAGNOSIS — I10 Essential (primary) hypertension: Secondary | ICD-10-CM

## 2017-04-10 DIAGNOSIS — D509 Iron deficiency anemia, unspecified: Secondary | ICD-10-CM

## 2017-04-10 DIAGNOSIS — F17200 Nicotine dependence, unspecified, uncomplicated: Secondary | ICD-10-CM | POA: Insufficient documentation

## 2017-04-10 DIAGNOSIS — K219 Gastro-esophageal reflux disease without esophagitis: Secondary | ICD-10-CM

## 2017-04-10 DIAGNOSIS — E559 Vitamin D deficiency, unspecified: Secondary | ICD-10-CM

## 2017-04-10 DIAGNOSIS — F411 Generalized anxiety disorder: Secondary | ICD-10-CM | POA: Diagnosis not present

## 2017-04-10 MED ORDER — ESCITALOPRAM OXALATE 10 MG PO TABS: 10.0000 mg | ORAL_TABLET | Freq: Every day | ORAL | 3 refills | Status: DC

## 2017-04-10 MED ORDER — HYDROCHLOROTHIAZIDE 12.5 MG PO TABS: 12.5000 mg | ORAL_TABLET | Freq: Every day | ORAL | 3 refills | Status: DC

## 2017-04-10 NOTE — Patient Instructions (Signed)
Iron Deficiency Anemia, Adult Iron deficiency anemia is a condition in which the concentration of red blood cells or hemoglobin in the blood is below normal because of too little iron. Hemoglobin is a substance in red blood cells that carries oxygen to the body's tissues. When the concentration of red blood cells or hemoglobin is too low, not enough oxygen reaches these tissues. Iron deficiency anemia is usually long-lasting (chronic) and it develops over time. It may or may not cause symptoms. It is a common type of anemia. What are the causes? This condition may be caused by:  Not enough iron in the diet.  Blood loss caused by bleeding in the intestine.  Blood loss from a gastrointestinal condition like Crohn disease.  Frequent blood draws, such as from blood donation.  Abnormal absorption in the gut.  Heavy menstrual periods in women.  Cancers of the gastrointestinal system, such as colon cancer.  What are the signs or symptoms? Symptoms of this condition may include:  Fatigue.  Headache.  Pale skin, lips, and nail beds.  Poor appetite.  Weakness.  Shortness of breath.  Dizziness.  Cold hands and feet.  Fast or irregular heartbeat.  Irritability. This is more common in severe anemia.  Rapid breathing. This is more common in severe anemia.  Mild anemia may not cause any symptoms. How is this diagnosed? This condition is diagnosed based on:  Your medical history.  A physical exam.  Blood tests.  You may have additional tests to find the underlying cause of your anemia, such as:  Testing for blood in the stool (fecal occult blood test).  A procedure to see inside your colon and rectum (colonoscopy).  A procedure to see inside your esophagus and stomach (endoscopy).  A test in which cells are removed from bone marrow (bone marrow aspiration) or fluid is removed from the bone marrow to be examined (biopsy). This is rarely needed.  How is this  treated? This condition is treated by correcting the cause of your iron deficiency. Treatment may involve:  Adding iron-rich foods to your diet.  Taking iron supplements. If you are pregnant or breastfeeding, you may need to take extra iron because your normal diet usually does not provide the amount of iron that you need.  Increasing vitamin C intake. Vitamin C helps your body absorb iron. Your health care provider may recommend that you take iron supplements along with a glass of orange juice or a vitamin C supplement.  Medicines to make heavy menstrual flow lighter.  Surgery.  You may need repeat blood tests to determine whether treatment is working. Depending on the underlying cause, the anemia should be corrected within 2 months of starting treatment. If the treatment does not seem to be working, you may need more testing. Follow these instructions at home: Medicines  Take over-the-counter and prescription medicines only as told by your health care provider. This includes iron supplements and vitamins.  If you cannot tolerate taking iron supplements by mouth, talk with your health care provider about taking them through a vein (intravenously) or an injection into a muscle.  For the best iron absorption, you should take iron supplements when your stomach is empty. If you cannot tolerate them on an empty stomach, you may need to take them with food.  Do not drink milk or take antacids at the same time as your iron supplements. Milk and antacids may interfere with iron absorption.  Iron supplements can cause constipation. To prevent constipation, include fiber   in your diet as told by your health care provider. A stool softener may also be recommended. Eating and drinking  Talk with your health care provider before changing your diet. He or she may recommend that you eat foods that contain a lot of iron, such as: ? Liver. ? Low-fat (lean) beef. ? Breads and cereals that have iron  added to them (are fortified). ? Eggs. ? Dried fruit. ? Dark green, leafy vegetables.  To help your body use the iron from iron-rich foods, eat those foods at the same time as fresh fruits and vegetables that are high in vitamin C. Foods that are high in vitamin C include: ? Oranges. ? Peppers. ? Tomatoes. ? Mangoes.  Drinkenoughfluid to keep your urine clear or pale yellow. General instructions  Return to your normal activities as told by your health care provider. Ask your health care provider what activities are safe for you.  Practice good hygiene. Anemia can make you more prone to illness and infection.  Keep all follow-up visits as told by your health care provider. This is important. Contact a health care provider if:  You feel nauseous or you vomit.  You feel weak.  You have unexplained sweating.  You develop symptoms of constipation, such as: ? Having fewer than three bowel movements a week. ? Straining to have a bowel movement. ? Having stools that are hard, dry, or larger than normal. ? Feeling full or bloated. ? Pain in the lower abdomen. ? Not feeling relief after having a bowel movement. Get help right away if:  You faint. If this happens, do not drive yourself to the hospital. Call your local emergency services (911 in the [place]  You have chest pain.  You have shortness of breath that: ? Is severe. ? Gets worse with physical activity.  You have a rapid heartbeat.  You become light-headed when getting up from a sitting or lying down position. This information is not intended to replace advice given to you by your health care provider. Make sure you discuss any questions you have with your health care provider. Document Released: 07/12/2000 Document Revised: 04/03/2016 Document Reviewed: 04/03/2016 Elsevier Interactive Patient Education  2018 [employer]  

## 2017-04-10 NOTE — Progress Notes (Signed)
Subjective:    Patient ID: Tammie Bradshaw, female    DOB: January 31, 1976, 41 y.o.   MRN: [id number]  Pt presents to the office today for chronic follow up.  Hypertension  This is a chronic problem. The current episode started more than 1 year ago. The problem has been waxing and waning since onset. The problem is uncontrolled. Associated symptoms include anxiety, malaise/fatigue and peripheral edema ("at times"). Pertinent negatives include no shortness of breath. Risk factors for coronary artery disease include dyslipidemia, obesity, sedentary lifestyle and family history. Past treatments include lifestyle changes. The current treatment provides no improvement. There is no history of kidney disease, CAD/MI, CVA or heart failure.  Gastroesophageal Reflux  She complains of belching and heartburn. She reports no coughing. This is a chronic problem. The current episode started more than 1 year ago. The problem occurs frequently. The symptoms are aggravated by certain foods. Risk factors include obesity. She has tried an antacid for the symptoms. The treatment provided mild relief.  Hyperlipidemia  This is a chronic problem. The current episode started more than 1 year ago. The problem is uncontrolled. Recent lipid tests were reviewed and are high. Exacerbating diseases include obesity. Pertinent negatives include no shortness of breath. She is currently on no antihyperlipidemic treatment. The current treatment provides no improvement of lipids. Risk factors for coronary artery disease include dyslipidemia, family history, obesity, hypertension and post-menopausal.  Anemia  Presents for follow-up visit. Symptoms include malaise/fatigue. There has been no bruising/bleeding easily, light-headedness or pica. There is no history of heart failure.  Anxiety  Presents for follow-up visit. Symptoms include excessive worry, irritability and nervous/anxious behavior. Patient reports no panic or shortness of breath.  Symptoms occur occasionally.   Her past medical history is significant for anemia.      Review of Systems  Constitutional: Positive for irritability and malaise/fatigue.  Respiratory: Negative for cough and shortness of breath.   Gastrointestinal: Positive for heartburn.  Neurological: Negative for light-headedness.  Hematological: Does not bruise/bleed easily.  Psychiatric/Behavioral: The patient is nervous/anxious.   All other systems reviewed and are negative.      Objective:   Physical Exam  Constitutional: She is oriented to person, place, and time. She appears well-developed and well-nourished. No distress.  HENT:  Head: Normocephalic and atraumatic.  Right Ear: External ear normal.  Left Ear: External ear normal.  Nose: Nose normal.  Mouth/Throat: Oropharynx is clear and moist.  Eyes: Pupils are equal, round, and reactive to light.  Neck: Normal range of motion. Neck supple. No thyromegaly present.  Cardiovascular: Normal rate, regular rhythm, normal heart sounds and intact distal pulses.   No murmur heard. Pulmonary/Chest: Effort normal and breath sounds normal. No respiratory distress. She has no wheezes.  Abdominal: Soft. Bowel sounds are normal. She exhibits no distension. There is no tenderness.  Musculoskeletal: Normal range of motion. She exhibits no edema or tenderness.  Neurological: She is alert and oriented to person, place, and time.  Skin: Skin is warm and dry.  Psychiatric: She has a normal mood and affect. Her behavior is normal. Judgment and thought content normal.  Vitals reviewed.    BP (!) 154/87   Pulse 73   Temp 99.4 F (37.4 C) (Oral)   Ht 5' 3"  (1.6 m)   Wt 199 lb 12.8 oz (90.6 kg)   BMI 35.39 kg/m      Assessment & Plan:  1. Gastroesophageal reflux disease, esophagitis presence not specified - CMP14+EGFR  2. GAD (generalized  anxiety disorder) Pt started on lexapro 10 mg today Stress management discussed - CMP14+EGFR -  escitalopram (LEXAPRO) 10 MG tablet; Take 1 tablet (10 mg total) by mouth daily.  Dispense: 90 tablet; Refill: 3  3. Iron deficiency anemia, unspecified iron deficiency anemia type Diet discussed Start iron OTC BID with stool softener - CMP14+EGFR - Anemia Profile B  4. Vitamin D deficiency - CMP14+EGFR  5. Hyperlipidemia, unspecified hyperlipidemia type - CMP14+EGFR  6. Current smoker Smoking cessation discussed - CMP14+EGFR  7. Essential hypertension HCTZ 12.5 mg started today  - CMP14+EGFR - hydrochlorothiazide (HYDRODIURIL) 12.5 MG tablet; Take 1 tablet (12.5 mg total) by mouth daily.  Dispense: 90 tablet; Refill: 3   Continue all meds Labs pending Health Maintenance reviewed Diet and exercise encouraged RTO 2 weeks to recheck HTN  Evelina Dun, FNP

## 2017-04-11 LAB — CMP14+EGFR
ALBUMIN: 4.4 g/dL (ref 3.5–5.5)
ALT: 14 IU/L (ref 0–32)
AST: 13 IU/L (ref 0–40)
Albumin/Globulin Ratio: 1.8 (ref 1.2–2.2)
Alkaline Phosphatase: 55 IU/L (ref 39–117)
BUN/Creatinine Ratio: 24 — ABNORMAL HIGH (ref 9–23)
BUN: 12 mg/dL (ref 6–20)
Bilirubin Total: 0.3 mg/dL (ref 0.0–1.2)
CALCIUM: 8.8 mg/dL (ref 8.7–10.2)
CO2: 23 mmol/L (ref 20–29)
CREATININE: 0.5 mg/dL — AB (ref 0.57–1.00)
Chloride: 100 mmol/L (ref 96–106)
GFR calc Af Amer: 142 mL/min/{1.73_m2} (ref >59)
GFR, EST NON AFRICAN AMERICAN: 123 mL/min/{1.73_m2} (ref >59)
GLOBULIN, TOTAL: 2.4 g/dL (ref 1.5–4.5)
Glucose: 83 mg/dL (ref 65–99)
Potassium: 3.9 mmol/L (ref 3.5–5.2)
SODIUM: 138 mmol/L (ref 134–144)
Total Protein: 6.8 g/dL (ref 6.0–8.5)

## 2017-04-11 LAB — ANEMIA PROFILE B
BASOS: 1 %
Basophils Absolute: 0.1 10*3/uL (ref 0.0–0.2)
EOS (ABSOLUTE): 0.2 10*3/uL (ref 0.0–0.4)
EOS: 2 %
FERRITIN: 7 ng/mL — AB (ref 15–150)
Folate: 14.8 ng/mL (ref >3.0)
Hematocrit: 30.1 % — ABNORMAL LOW (ref 34.0–46.6)
Hemoglobin: 9.5 g/dL — ABNORMAL LOW (ref 11.1–15.9)
IMMATURE GRANS (ABS): 0 10*3/uL (ref 0.0–0.1)
IMMATURE GRANULOCYTES: 0 %
IRON SATURATION: 7 % — AB (ref 15–55)
IRON: 26 ug/dL — AB (ref 27–159)
Lymphocytes Absolute: 3.4 10*3/uL — ABNORMAL HIGH (ref 0.7–3.1)
Lymphs: 38 %
MCH: 24 pg — AB (ref 26.6–33.0)
MCHC: 31.6 g/dL (ref 31.5–35.7)
MCV: 76 fL — ABNORMAL LOW (ref 79–97)
MONOCYTES: 7 %
MONOS ABS: 0.7 10*3/uL (ref 0.1–0.9)
Neutrophils Absolute: 4.7 10*3/uL (ref 1.4–7.0)
Neutrophils: 52 %
Platelets: 362 10*3/uL (ref 150–379)
RBC: 3.96 x10E6/uL (ref 3.77–5.28)
RDW: 18.3 % — ABNORMAL HIGH (ref 12.3–15.4)
RETIC CT PCT: 2.1 % (ref 0.6–2.6)
Total Iron Binding Capacity: 388 ug/dL (ref 250–450)
UIBC: 362 ug/dL (ref 131–425)
Vitamin B-12: 460 pg/mL (ref 232–1245)
WBC: 9 10*3/uL (ref 3.4–10.8)

## 2017-04-22 DIAGNOSIS — F17200 Nicotine dependence, unspecified, uncomplicated: Secondary | ICD-10-CM | POA: Diagnosis not present

## 2017-04-22 DIAGNOSIS — Z8249 Family history of ischemic heart disease and other diseases of the circulatory system: Secondary | ICD-10-CM | POA: Diagnosis not present

## 2017-04-22 DIAGNOSIS — R319 Hematuria, unspecified: Secondary | ICD-10-CM | POA: Diagnosis not present

## 2017-04-22 DIAGNOSIS — R109 Unspecified abdominal pain: Secondary | ICD-10-CM | POA: Diagnosis not present

## 2017-04-22 DIAGNOSIS — N200 Calculus of kidney: Secondary | ICD-10-CM | POA: Diagnosis not present

## 2017-04-22 DIAGNOSIS — N390 Urinary tract infection, site not specified: Secondary | ICD-10-CM | POA: Diagnosis not present

## 2017-04-22 DIAGNOSIS — I10 Essential (primary) hypertension: Secondary | ICD-10-CM | POA: Diagnosis not present

## 2017-04-22 DIAGNOSIS — Z79899 Other long term (current) drug therapy: Secondary | ICD-10-CM | POA: Diagnosis not present

## 2017-04-22 DIAGNOSIS — N23 Unspecified renal colic: Secondary | ICD-10-CM | POA: Diagnosis not present

## 2017-04-24 ENCOUNTER — Encounter: Payer: Self-pay | Admitting: *Deleted

## 2017-04-25 ENCOUNTER — Ambulatory Visit: Payer: BLUE CROSS/BLUE SHIELD | Admitting: Pediatrics

## 2017-05-10 ENCOUNTER — Encounter: Payer: Self-pay | Admitting: Pediatrics

## 2017-08-29 IMAGING — DX DG ABDOMEN 1V
1 series · 1 of 1 positions shown · non-contrast
Comparison: 04/12/16

CLINICAL DATA: Abdominal pain.

EXAM:
ABDOMEN - 1 VIEW

[abdomen kub]
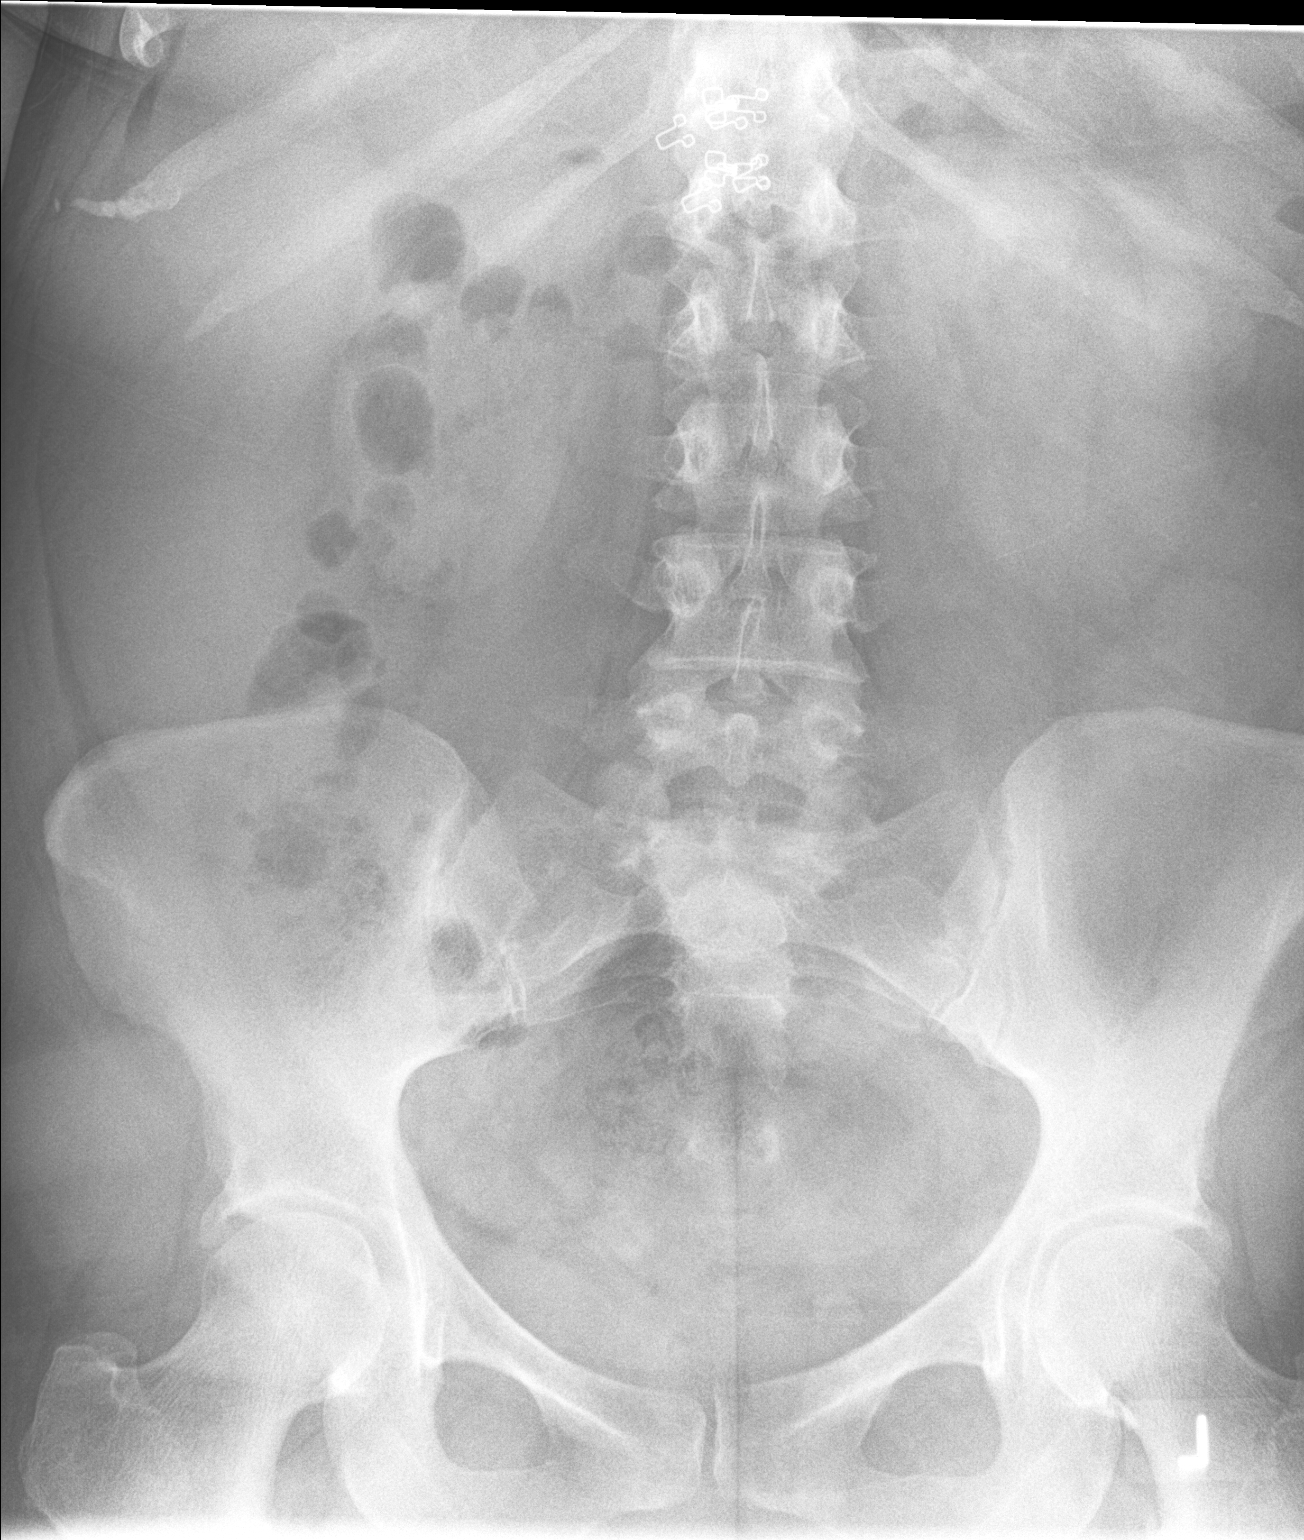

[1 of 1 positions shown; findings below may reference images not displayed]

FINDINGS: The bowel gas pattern is normal. No radio-opaque calculi or other
significant radiographic abnormality are seen.
IMPRESSION: Negative.

## 2017-09-10 DIAGNOSIS — M779 Enthesopathy, unspecified: Secondary | ICD-10-CM | POA: Diagnosis not present

## 2017-09-10 DIAGNOSIS — M25522 Pain in left elbow: Secondary | ICD-10-CM | POA: Diagnosis not present

## 2017-09-10 DIAGNOSIS — F17200 Nicotine dependence, unspecified, uncomplicated: Secondary | ICD-10-CM | POA: Diagnosis not present

## 2017-09-10 DIAGNOSIS — Z79899 Other long term (current) drug therapy: Secondary | ICD-10-CM | POA: Diagnosis not present

## 2017-09-10 DIAGNOSIS — I10 Essential (primary) hypertension: Secondary | ICD-10-CM | POA: Diagnosis not present

## 2017-09-10 DIAGNOSIS — Z8249 Family history of ischemic heart disease and other diseases of the circulatory system: Secondary | ICD-10-CM | POA: Diagnosis not present

## 2017-09-24 DIAGNOSIS — R0789 Other chest pain: Secondary | ICD-10-CM | POA: Diagnosis not present

## 2017-09-24 DIAGNOSIS — Z6837 Body mass index (BMI) 37.0-37.9, adult: Secondary | ICD-10-CM | POA: Diagnosis not present

## 2017-09-24 DIAGNOSIS — H8309 Labyrinthitis, unspecified ear: Secondary | ICD-10-CM | POA: Diagnosis not present

## 2017-09-24 DIAGNOSIS — R110 Nausea: Secondary | ICD-10-CM | POA: Diagnosis not present

## 2017-11-13 DIAGNOSIS — R1012 Left upper quadrant pain: Secondary | ICD-10-CM | POA: Diagnosis not present

## 2017-11-13 DIAGNOSIS — Z6835 Body mass index (BMI) 35.0-35.9, adult: Secondary | ICD-10-CM | POA: Diagnosis not present

## 2017-11-13 DIAGNOSIS — R109 Unspecified abdominal pain: Secondary | ICD-10-CM | POA: Diagnosis not present

## 2018-01-06 DIAGNOSIS — Z87442 Personal history of urinary calculi: Secondary | ICD-10-CM | POA: Diagnosis not present

## 2018-01-06 DIAGNOSIS — N938 Other specified abnormal uterine and vaginal bleeding: Secondary | ICD-10-CM | POA: Diagnosis not present

## 2018-01-06 DIAGNOSIS — N12 Tubulo-interstitial nephritis, not specified as acute or chronic: Secondary | ICD-10-CM | POA: Diagnosis not present

## 2018-01-06 DIAGNOSIS — F17200 Nicotine dependence, unspecified, uncomplicated: Secondary | ICD-10-CM | POA: Diagnosis not present

## 2018-01-06 DIAGNOSIS — N10 Acute tubulo-interstitial nephritis: Secondary | ICD-10-CM | POA: Diagnosis not present

## 2018-01-06 DIAGNOSIS — I10 Essential (primary) hypertension: Secondary | ICD-10-CM | POA: Diagnosis not present

## 2018-01-06 DIAGNOSIS — N390 Urinary tract infection, site not specified: Secondary | ICD-10-CM | POA: Diagnosis not present

## 2018-02-05 DIAGNOSIS — N938 Other specified abnormal uterine and vaginal bleeding: Secondary | ICD-10-CM | POA: Diagnosis not present

## 2018-02-05 DIAGNOSIS — N939 Abnormal uterine and vaginal bleeding, unspecified: Secondary | ICD-10-CM | POA: Diagnosis not present

## 2018-02-05 DIAGNOSIS — F17200 Nicotine dependence, unspecified, uncomplicated: Secondary | ICD-10-CM | POA: Diagnosis not present

## 2018-02-05 DIAGNOSIS — R1030 Lower abdominal pain, unspecified: Secondary | ICD-10-CM | POA: Diagnosis not present

## 2018-02-05 DIAGNOSIS — I10 Essential (primary) hypertension: Secondary | ICD-10-CM | POA: Diagnosis not present

## 2018-02-23 DIAGNOSIS — N920 Excessive and frequent menstruation with regular cycle: Secondary | ICD-10-CM | POA: Diagnosis not present

## 2018-03-17 DIAGNOSIS — N921 Excessive and frequent menstruation with irregular cycle: Secondary | ICD-10-CM | POA: Diagnosis not present

## 2018-03-17 DIAGNOSIS — K219 Gastro-esophageal reflux disease without esophagitis: Secondary | ICD-10-CM | POA: Diagnosis not present

## 2018-03-17 DIAGNOSIS — D500 Iron deficiency anemia secondary to blood loss (chronic): Secondary | ICD-10-CM | POA: Diagnosis not present

## 2018-03-17 DIAGNOSIS — F419 Anxiety disorder, unspecified: Secondary | ICD-10-CM | POA: Diagnosis not present

## 2018-03-17 DIAGNOSIS — Z79899 Other long term (current) drug therapy: Secondary | ICD-10-CM | POA: Diagnosis not present

## 2018-03-17 DIAGNOSIS — F17200 Nicotine dependence, unspecified, uncomplicated: Secondary | ICD-10-CM | POA: Diagnosis not present

## 2018-03-18 DIAGNOSIS — D500 Iron deficiency anemia secondary to blood loss (chronic): Secondary | ICD-10-CM | POA: Diagnosis not present

## 2018-03-18 DIAGNOSIS — N921 Excessive and frequent menstruation with irregular cycle: Secondary | ICD-10-CM | POA: Diagnosis not present

## 2018-03-18 DIAGNOSIS — Z79899 Other long term (current) drug therapy: Secondary | ICD-10-CM | POA: Diagnosis not present

## 2018-03-18 DIAGNOSIS — N920 Excessive and frequent menstruation with regular cycle: Secondary | ICD-10-CM | POA: Diagnosis not present

## 2018-03-18 DIAGNOSIS — K219 Gastro-esophageal reflux disease without esophagitis: Secondary | ICD-10-CM | POA: Diagnosis not present

## 2018-03-18 DIAGNOSIS — F17200 Nicotine dependence, unspecified, uncomplicated: Secondary | ICD-10-CM | POA: Diagnosis not present

## 2018-03-18 DIAGNOSIS — F419 Anxiety disorder, unspecified: Secondary | ICD-10-CM | POA: Diagnosis not present

## 2018-03-19 DIAGNOSIS — N921 Excessive and frequent menstruation with irregular cycle: Secondary | ICD-10-CM | POA: Diagnosis not present

## 2018-03-19 DIAGNOSIS — D500 Iron deficiency anemia secondary to blood loss (chronic): Secondary | ICD-10-CM | POA: Diagnosis not present

## 2018-03-19 DIAGNOSIS — F17200 Nicotine dependence, unspecified, uncomplicated: Secondary | ICD-10-CM | POA: Diagnosis not present

## 2018-03-19 DIAGNOSIS — F419 Anxiety disorder, unspecified: Secondary | ICD-10-CM | POA: Diagnosis not present

## 2018-03-19 DIAGNOSIS — K219 Gastro-esophageal reflux disease without esophagitis: Secondary | ICD-10-CM | POA: Diagnosis not present

## 2018-03-19 DIAGNOSIS — Z79899 Other long term (current) drug therapy: Secondary | ICD-10-CM | POA: Diagnosis not present

## 2018-03-27 DIAGNOSIS — I10 Essential (primary) hypertension: Secondary | ICD-10-CM | POA: Diagnosis not present

## 2018-04-27 DIAGNOSIS — R079 Chest pain, unspecified: Secondary | ICD-10-CM | POA: Diagnosis not present

## 2018-04-27 DIAGNOSIS — Z6835 Body mass index (BMI) 35.0-35.9, adult: Secondary | ICD-10-CM | POA: Diagnosis not present

## 2018-05-30 IMAGING — DX DG THORACIC SPINE 3V
3 series · 3 of 3 positions shown · non-contrast
Comparison: None.

CLINICAL DATA: Dorsalgia

EXAM:
THORACIC SPINE - 3 VIEWS

[t-spine ap]
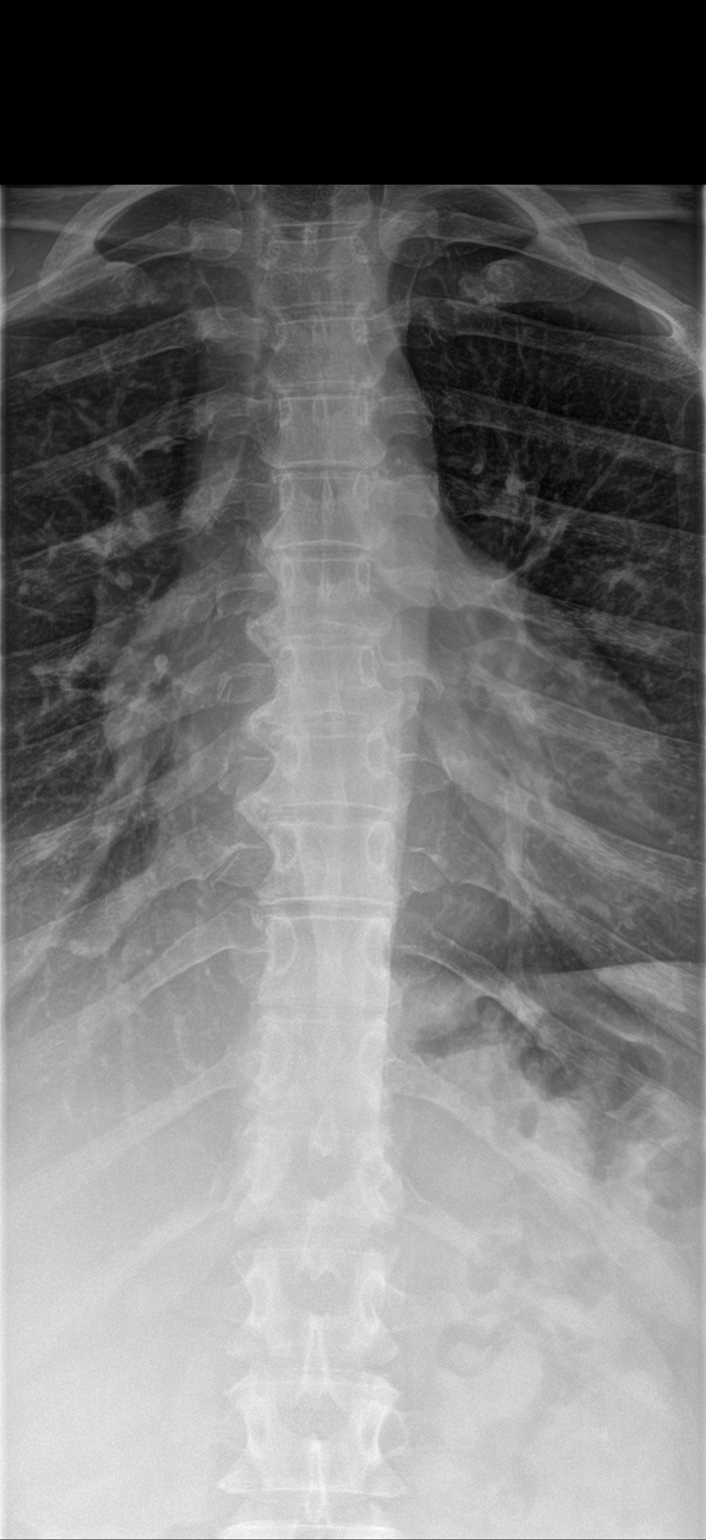

[t-spine lat]
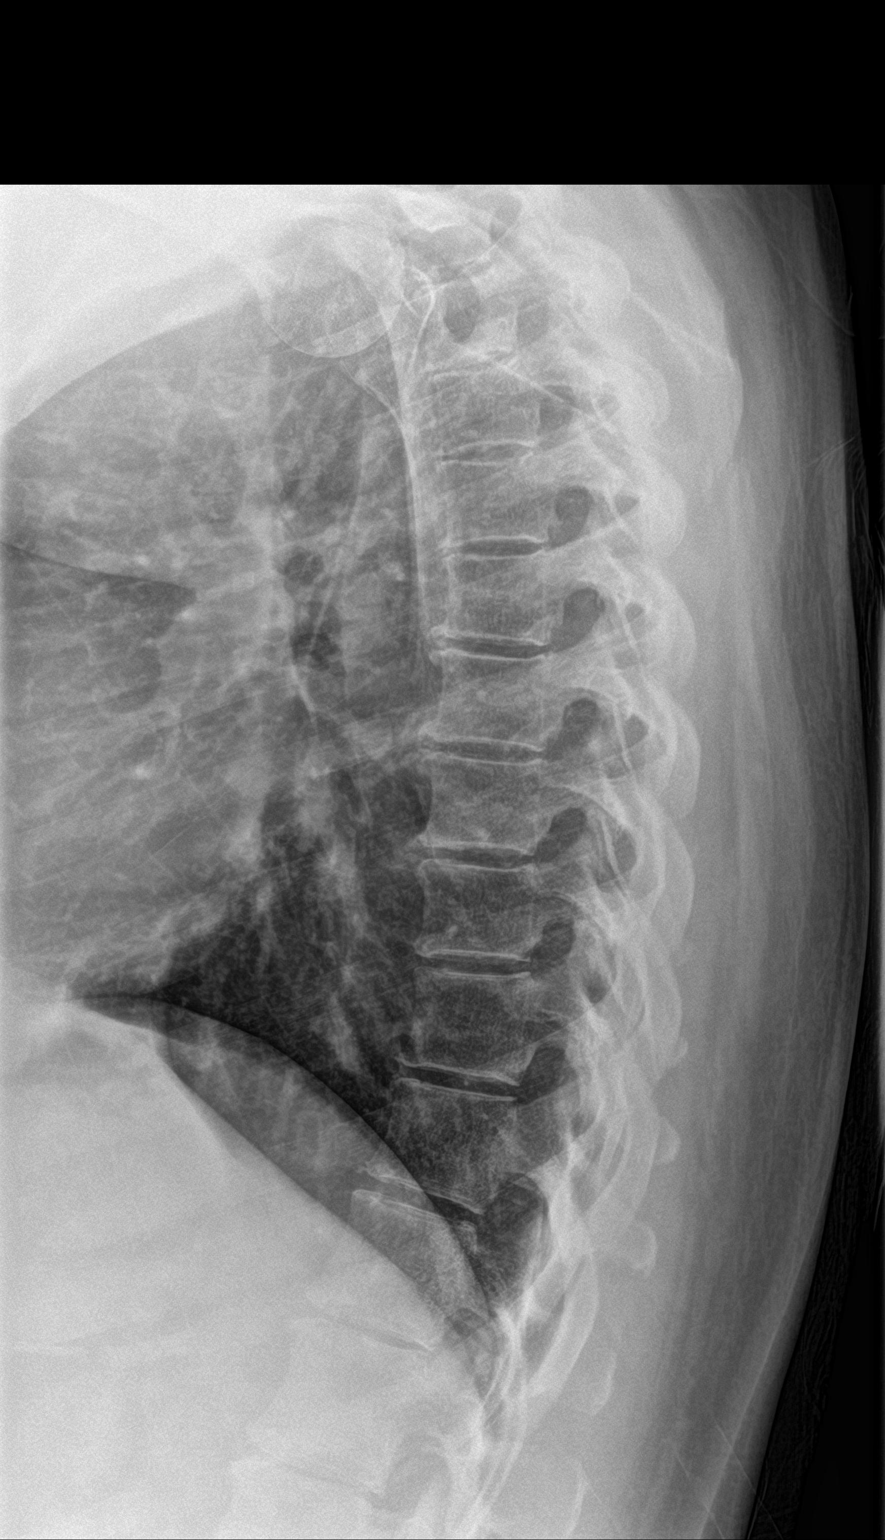

[t-spine swimmers]
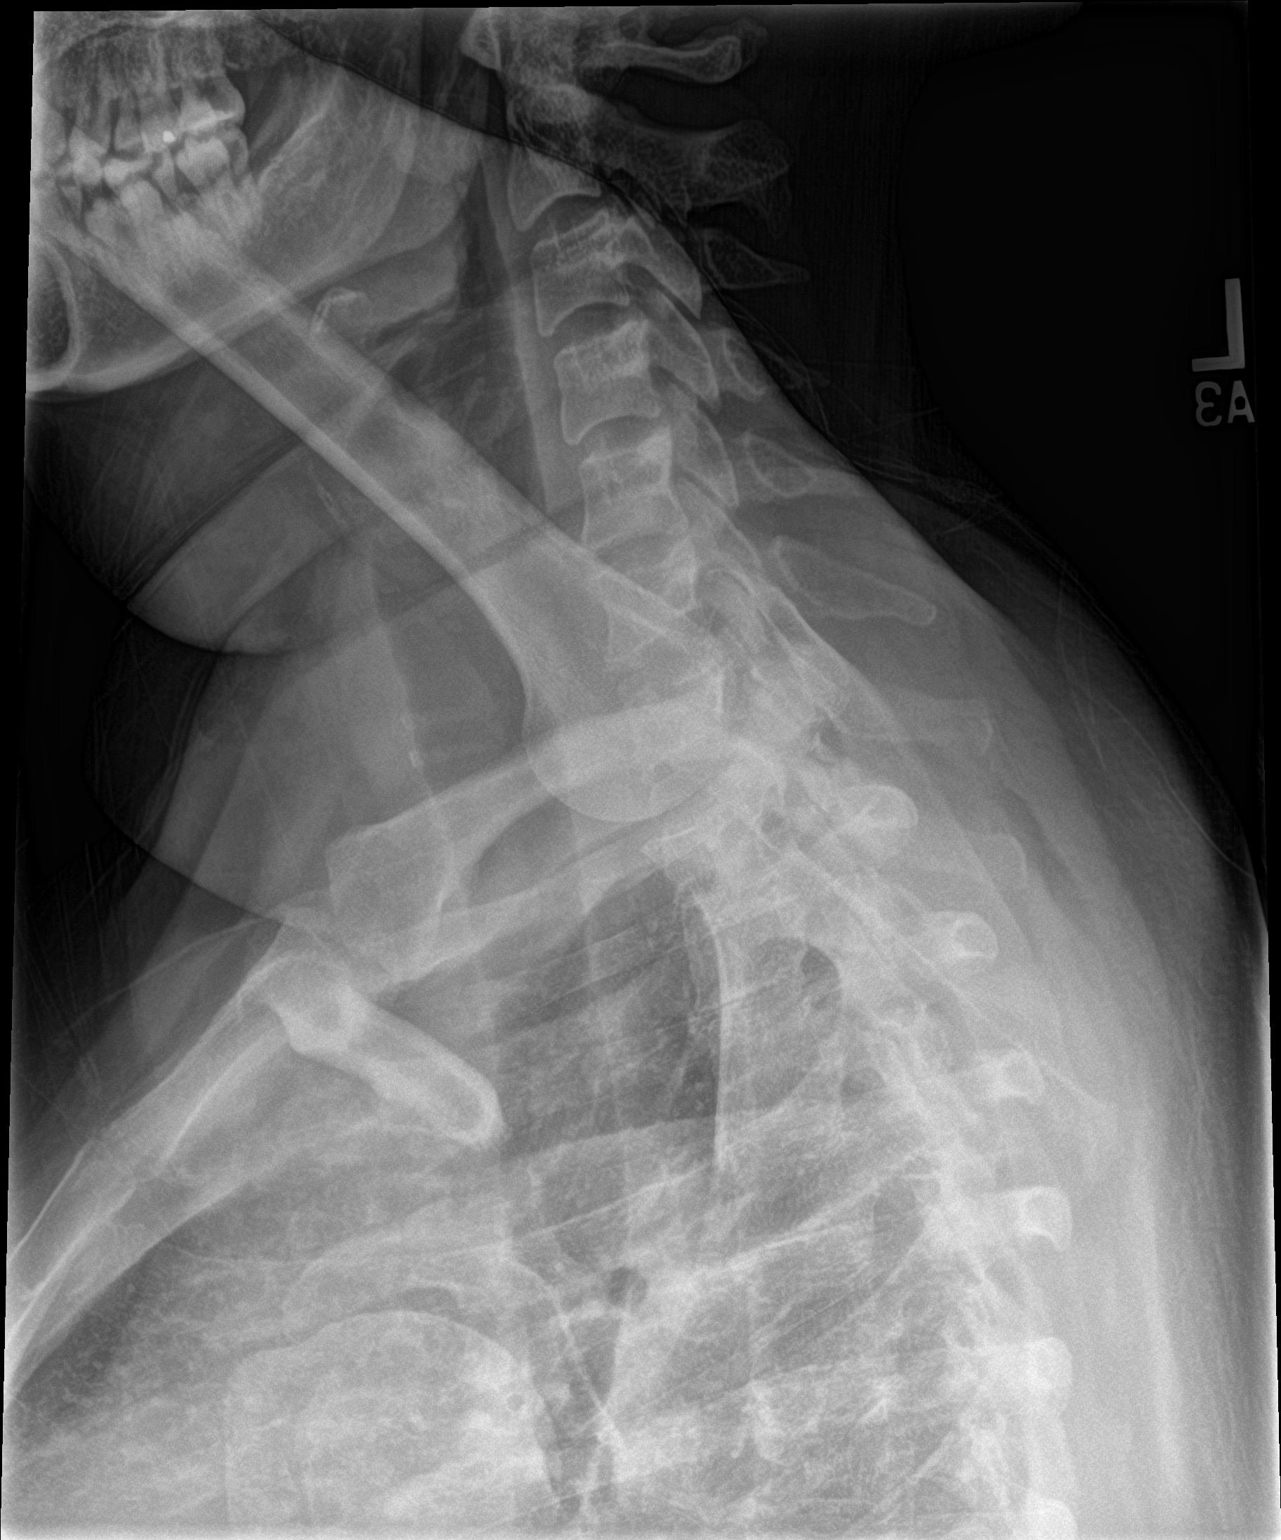

[3 of 3 positions shown; findings below may reference images not displayed]

FINDINGS: Frontal, lateral, and swimmer's views were obtained. There is slight
mid thoracic levoscoliosis. No fracture or spondylolisthesis. There
are several small anterior and right-sided osteophytes. There is
slight disc space narrowing at several levels. No erosive change or
paraspinous lesion.
IMPRESSION: Slight osteoarthritic change at several levels. Mild scoliosis. No
fracture or spondylolisthesis.

## 2018-06-08 DIAGNOSIS — Z6834 Body mass index (BMI) 34.0-34.9, adult: Secondary | ICD-10-CM | POA: Diagnosis not present

## 2018-06-08 DIAGNOSIS — J208 Acute bronchitis due to other specified organisms: Secondary | ICD-10-CM | POA: Diagnosis not present

## 2018-06-08 DIAGNOSIS — R05 Cough: Secondary | ICD-10-CM | POA: Diagnosis not present

## 2018-06-16 DIAGNOSIS — J3089 Other allergic rhinitis: Secondary | ICD-10-CM | POA: Diagnosis not present

## 2018-06-16 DIAGNOSIS — Z6835 Body mass index (BMI) 35.0-35.9, adult: Secondary | ICD-10-CM | POA: Diagnosis not present

## 2018-06-16 DIAGNOSIS — J40 Bronchitis, not specified as acute or chronic: Secondary | ICD-10-CM | POA: Diagnosis not present

## 2018-06-16 DIAGNOSIS — E668 Other obesity: Secondary | ICD-10-CM | POA: Diagnosis not present

## 2018-06-16 DIAGNOSIS — I10 Essential (primary) hypertension: Secondary | ICD-10-CM | POA: Diagnosis not present

## 2018-06-30 DIAGNOSIS — Z1231 Encounter for screening mammogram for malignant neoplasm of breast: Secondary | ICD-10-CM | POA: Diagnosis not present

## 2018-09-16 DIAGNOSIS — Z6834 Body mass index (BMI) 34.0-34.9, adult: Secondary | ICD-10-CM | POA: Diagnosis not present

## 2018-09-16 DIAGNOSIS — Z0000 Encounter for general adult medical examination without abnormal findings: Secondary | ICD-10-CM | POA: Diagnosis not present

## 2018-10-14 DIAGNOSIS — F331 Major depressive disorder, recurrent, moderate: Secondary | ICD-10-CM | POA: Diagnosis not present

## 2018-10-14 DIAGNOSIS — J0191 Acute recurrent sinusitis, unspecified: Secondary | ICD-10-CM | POA: Diagnosis not present

## 2018-10-14 DIAGNOSIS — I10 Essential (primary) hypertension: Secondary | ICD-10-CM | POA: Diagnosis not present

## 2019-02-07 DIAGNOSIS — R000 Tachycardia, unspecified: Secondary | ICD-10-CM | POA: Diagnosis not present

## 2019-02-07 DIAGNOSIS — I10 Essential (primary) hypertension: Secondary | ICD-10-CM | POA: Diagnosis not present

## 2019-02-07 DIAGNOSIS — Z1159 Encounter for screening for other viral diseases: Secondary | ICD-10-CM | POA: Diagnosis not present

## 2019-02-07 DIAGNOSIS — F17200 Nicotine dependence, unspecified, uncomplicated: Secondary | ICD-10-CM | POA: Diagnosis not present

## 2019-02-07 DIAGNOSIS — R45851 Suicidal ideations: Secondary | ICD-10-CM | POA: Diagnosis not present

## 2019-02-07 DIAGNOSIS — F10929 Alcohol use, unspecified with intoxication, unspecified: Secondary | ICD-10-CM | POA: Diagnosis not present

## 2019-02-07 DIAGNOSIS — F29 Unspecified psychosis not due to a substance or known physiological condition: Secondary | ICD-10-CM | POA: Diagnosis not present

## 2019-02-07 DIAGNOSIS — Z008 Encounter for other general examination: Secondary | ICD-10-CM | POA: Diagnosis not present

## 2019-02-10 DIAGNOSIS — F1020 Alcohol dependence, uncomplicated: Secondary | ICD-10-CM | POA: Diagnosis not present

## 2019-02-10 DIAGNOSIS — F17200 Nicotine dependence, unspecified, uncomplicated: Secondary | ICD-10-CM | POA: Diagnosis not present

## 2019-02-10 DIAGNOSIS — R45851 Suicidal ideations: Secondary | ICD-10-CM | POA: Diagnosis not present

## 2019-02-10 DIAGNOSIS — Z1159 Encounter for screening for other viral diseases: Secondary | ICD-10-CM | POA: Diagnosis not present

## 2019-02-10 DIAGNOSIS — I10 Essential (primary) hypertension: Secondary | ICD-10-CM | POA: Diagnosis not present

## 2019-02-10 DIAGNOSIS — F17210 Nicotine dependence, cigarettes, uncomplicated: Secondary | ICD-10-CM | POA: Diagnosis not present

## 2019-02-10 DIAGNOSIS — F332 Major depressive disorder, recurrent severe without psychotic features: Secondary | ICD-10-CM | POA: Diagnosis not present

## 2019-02-10 DIAGNOSIS — Y904 Blood alcohol level of 80-99 mg/100 ml: Secondary | ICD-10-CM | POA: Diagnosis not present

## 2019-02-10 DIAGNOSIS — Z9114 Patient's other noncompliance with medication regimen: Secondary | ICD-10-CM | POA: Diagnosis not present

## 2019-02-10 DIAGNOSIS — Z008 Encounter for other general examination: Secondary | ICD-10-CM | POA: Diagnosis not present

## 2019-05-17 DIAGNOSIS — R05 Cough: Secondary | ICD-10-CM | POA: Diagnosis not present

## 2019-05-17 DIAGNOSIS — R030 Elevated blood-pressure reading, without diagnosis of hypertension: Secondary | ICD-10-CM | POA: Diagnosis not present

## 2019-05-17 DIAGNOSIS — F17200 Nicotine dependence, unspecified, uncomplicated: Secondary | ICD-10-CM | POA: Diagnosis not present

## 2019-05-17 DIAGNOSIS — Z8249 Family history of ischemic heart disease and other diseases of the circulatory system: Secondary | ICD-10-CM | POA: Diagnosis not present

## 2019-05-17 DIAGNOSIS — R079 Chest pain, unspecified: Secondary | ICD-10-CM | POA: Diagnosis not present

## 2019-05-17 DIAGNOSIS — R0789 Other chest pain: Secondary | ICD-10-CM | POA: Diagnosis not present

## 2019-06-03 ENCOUNTER — Other Ambulatory Visit: Payer: Self-pay | Admitting: *Deleted

## 2019-06-03 DIAGNOSIS — Z20822 Contact with and (suspected) exposure to covid-19: Secondary | ICD-10-CM

## 2019-06-04 LAB — NOVEL CORONAVIRUS, NAA: SARS-CoV-2, NAA: NOT DETECTED

## 2019-08-18 DIAGNOSIS — R200 Anesthesia of skin: Secondary | ICD-10-CM | POA: Diagnosis not present

## 2019-08-18 DIAGNOSIS — R202 Paresthesia of skin: Secondary | ICD-10-CM | POA: Diagnosis not present

## 2019-08-18 DIAGNOSIS — F17200 Nicotine dependence, unspecified, uncomplicated: Secondary | ICD-10-CM | POA: Diagnosis not present

## 2019-08-19 DIAGNOSIS — R200 Anesthesia of skin: Secondary | ICD-10-CM | POA: Diagnosis not present

## 2019-08-19 DIAGNOSIS — M79605 Pain in left leg: Secondary | ICD-10-CM | POA: Diagnosis not present

## 2019-09-08 DIAGNOSIS — J029 Acute pharyngitis, unspecified: Secondary | ICD-10-CM | POA: Diagnosis not present

## 2019-09-08 DIAGNOSIS — R509 Fever, unspecified: Secondary | ICD-10-CM | POA: Diagnosis not present

## 2019-09-08 DIAGNOSIS — Z6831 Body mass index (BMI) 31.0-31.9, adult: Secondary | ICD-10-CM | POA: Diagnosis not present

## 2019-09-08 DIAGNOSIS — R432 Parageusia: Secondary | ICD-10-CM | POA: Diagnosis not present

## 2019-09-13 DIAGNOSIS — F17200 Nicotine dependence, unspecified, uncomplicated: Secondary | ICD-10-CM | POA: Diagnosis not present

## 2019-09-13 DIAGNOSIS — N200 Calculus of kidney: Secondary | ICD-10-CM | POA: Diagnosis not present

## 2019-09-13 DIAGNOSIS — K2900 Acute gastritis without bleeding: Secondary | ICD-10-CM | POA: Diagnosis not present

## 2019-09-13 DIAGNOSIS — R1011 Right upper quadrant pain: Secondary | ICD-10-CM | POA: Diagnosis not present

## 2019-09-23 DIAGNOSIS — Z113 Encounter for screening for infections with a predominantly sexual mode of transmission: Secondary | ICD-10-CM | POA: Diagnosis not present

## 2019-09-23 DIAGNOSIS — Z0000 Encounter for general adult medical examination without abnormal findings: Secondary | ICD-10-CM | POA: Diagnosis not present

## 2019-09-23 DIAGNOSIS — E559 Vitamin D deficiency, unspecified: Secondary | ICD-10-CM | POA: Diagnosis not present

## 2019-09-28 DIAGNOSIS — M719 Bursopathy, unspecified: Secondary | ICD-10-CM | POA: Diagnosis not present

## 2019-09-28 DIAGNOSIS — M71561 Other bursitis, not elsewhere classified, right knee: Secondary | ICD-10-CM | POA: Diagnosis not present

## 2019-09-28 DIAGNOSIS — K219 Gastro-esophageal reflux disease without esophagitis: Secondary | ICD-10-CM | POA: Diagnosis not present

## 2019-09-28 DIAGNOSIS — F17200 Nicotine dependence, unspecified, uncomplicated: Secondary | ICD-10-CM | POA: Diagnosis not present

## 2019-09-28 DIAGNOSIS — M25561 Pain in right knee: Secondary | ICD-10-CM | POA: Diagnosis not present

## 2019-09-28 DIAGNOSIS — Z79899 Other long term (current) drug therapy: Secondary | ICD-10-CM | POA: Diagnosis not present

## 2019-10-18 DIAGNOSIS — G8929 Other chronic pain: Secondary | ICD-10-CM | POA: Diagnosis not present

## 2019-10-18 DIAGNOSIS — M65261 Calcific tendinitis, right lower leg: Secondary | ICD-10-CM | POA: Diagnosis not present

## 2019-10-18 DIAGNOSIS — M25561 Pain in right knee: Secondary | ICD-10-CM | POA: Diagnosis not present

## 2019-10-18 DIAGNOSIS — S83241A Other tear of medial meniscus, current injury, right knee, initial encounter: Secondary | ICD-10-CM | POA: Diagnosis not present

## 2023-10-06 ENCOUNTER — Ambulatory Visit: Payer: BLUE CROSS/BLUE SHIELD | Admitting: Physician Assistant

## 2024-04-08 ENCOUNTER — Ambulatory Visit: Payer: Self-pay | Attending: Cardiology | Admitting: Cardiology

## 2024-04-08 ENCOUNTER — Encounter: Payer: Self-pay | Admitting: Cardiology

## 2024-04-08 VITALS — BP 144/88 | HR 82 | Ht 63.0 in | Wt 183.6 lb

## 2024-04-08 DIAGNOSIS — Z01810 Encounter for preprocedural cardiovascular examination: Secondary | ICD-10-CM | POA: Diagnosis not present

## 2024-04-08 DIAGNOSIS — R0789 Other chest pain: Secondary | ICD-10-CM | POA: Diagnosis not present

## 2024-04-08 DIAGNOSIS — I10 Essential (primary) hypertension: Secondary | ICD-10-CM | POA: Diagnosis not present

## 2024-04-08 DIAGNOSIS — E782 Mixed hyperlipidemia: Secondary | ICD-10-CM | POA: Diagnosis not present

## 2024-04-08 DIAGNOSIS — R079 Chest pain, unspecified: Secondary | ICD-10-CM

## 2024-04-08 MED ORDER — METOPROLOL TARTRATE 100 MG PO TABS
100.0000 mg | ORAL_TABLET | Freq: Once | ORAL | 0 refills | Status: AC
Start: 2024-04-08 — End: 2024-04-08

## 2024-04-08 MED ORDER — AMLODIPINE BESYLATE 5 MG PO TABS: 5.0000 mg | ORAL_TABLET | Freq: Every day | ORAL | 5 refills | Status: AC

## 2024-04-08 NOTE — Progress Notes (Signed)
      Clinical Summary Tammie Bradshaw is a 48 y.o.female seen today as a new consult, referred by PA Dow for the following medical problems.   1.Chest pain - 2019 evaluation at [facility] cards thought likely GERD  - chest pain started about 2 years. Midchest, stabbing like pain 7/10 in severity. Can occur at rest or with activity. Tingling down left arm. Not positional. Can last about 1 or minute so. Can get dizzy with episodes. Increase in frequency over time, stable severity - goes to gym and will do treadmill x 25-30 min fast walk which can bring out the pain, also DOE that is progressing.   12/2023 GXT: exercised 8 min, 10 METs, 1mm horizontal ST depression inferior leads, [facility] treadmill score of 3, intermediate risk.   CAD risk factors: HLD, + smoker nearly 30 years, mother MI age 72,HTN Aortic atherosclerosis  on CT Past Medical History:  Diagnosis Date   History of bilateral ligation of fallopian tubes    Kidney stones      No Known Allergies   Current Outpatient Medications  Medication Sig Dispense Refill   atorvastatin  (LIPITOR) 10 MG tablet Take 10 mg by mouth daily. (Patient not taking: Reported on 04/08/2024)     famotidine (PEPCID) 20 MG tablet Take 20 mg by mouth daily. (Patient not taking: Reported on 04/08/2024)     metoprolol  succinate (TOPROL -XL) 25 MG 24 hr tablet Take 25 mg by mouth daily. (Patient not taking: Reported on 04/08/2024)     No current facility-administered medications for this visit.     Past Surgical History:  Procedure Laterality Date   LITHOTRIPSY Bilateral    PARTIAL HYSTERECTOMY     TUBAL LIGATION     WISDOM TOOTH EXTRACTION       No Known Allergies    Family History  Problem Relation Age of Onset   Heart disease Mother      Social History Tammie Bradshaw reports that she has been smoking cigarettes. She has been exposed to tobacco smoke. She has never used smokeless tobacco. Tammie Bradshaw reports no history of alcohol  use.     Physical Examination Today's Vitals   04/08/24 1446 04/08/24 1527  BP: (!) 140/102 (!) 144/88  Pulse: 82   SpO2: 97%   Weight: 183 lb 9.6 oz (83.3 kg)   Height: 5' 3 (1.6 m)    Body mass index is 32.52 kg/m.  Gen: resting comfortably, no acute distress HEENT: no scleral icterus, pupils equal round and reactive, no palptable cervical adenopathy,  CV: RRR, no mrg, no jvd Resp: Clear to auscultation bilaterally GI: abdomen is soft, non-tender, non-distended, normal bowel sounds, no hepatosplenomegaly MSK: extremities are warm, no edema.  Skin: warm, no rash Neuro:  no focal deficits Psych: appropriate affect     Assessment and Plan  1.Chest pain - multiple CAD risk factors as listed above - recent GXT with 1mm ST depression inferior leads - plan for coronary CTA to further evaluate  2. HLD - request pcp labs - encouraged to start taking her atorvastatin   3. HTN - elevated bp, would start norvasc  5mg  daily Can stop toprol , I don't see a secondary indication for a beta blocker   F/u 6 week      Dorn PHEBE Ross, M.D.

## 2024-04-08 NOTE — Patient Instructions (Addendum)
 Medication Instructions:  Your physician has recommended you make the following change in your medication:  Stop taking Metoprolol  Succinate Start taking Amlodipine  5 mg once daily Continue taking all other medications as prescribed  Labwork: BMET to be completed 1-2 weeks prior to Coronary CTA Will request labs from Primary Care Physician   Testing/Procedures:   Your cardiac CT will be scheduled at one of the below locations:   [facility] [address] [place], [place] [address] [phone] (Severe contrast allergies only)  OR   [facility] [address]  [place], [place] [address] [phone]   If scheduled at [facility], please arrive at the [facility] and Children's Entrance (Entrance C2) of [facility] 30 minutes prior to test start time. You can use the FREE valet parking offered at entrance C (encouraged to control the heart rate for the test)  Proceed to the [facility] Radiology Department (first floor) to check-in and test prep.  All radiology patients and guests should use entrance C2 at [facility], accessed from [facility], even though the hospital's physical address listed is [address].  If scheduled at the Heart and Vascular Tower at [address], please enter the parking lot using the [address] entrance and use the FREE valet service at the patient drop-off area. Enter the building and check-in with registration on the main floor.   Please follow these instructions carefully (unless otherwise directed):  An IV will be required for this test and Nitroglycerin will be given.  Hold all erectile dysfunction medications at least 3 days (72 hrs) prior to test. (Ie viagra, cialis, sildenafil, tadalafil, etc)   On the Night Before the Test: Be sure to Drink plenty of water. Do not consume any caffeinated/decaffeinated beverages or chocolate 12 hours prior to  your test. Do not take any antihistamines 12 hours prior to your test.  If the patient has contrast allergy: No allergy  On the Day of the Test: Drink plenty of water until 1 hour prior to the test. Do not eat any food 1 hour prior to test. You may take your regular medications prior to the test.  Take Metoprolol  100 mg (Lopressor ) two hours prior to test. Patients who wear a continuous glucose monitor MUST remove the device prior to scanning. FEMALES- please wear underwire-free bra if available, avoid dresses & tight clothing       After the Test: Drink plenty of water. After receiving IV contrast, you may experience a mild flushed feeling. This is normal. On occasion, you may experience a mild rash up to 24 hours after the test. This is not dangerous. If this occurs, you can take Benadryl 25 mg, Zyrtec, Claritin, or Allegra and increase your fluid intake. (Patients taking Tikosyn should avoid Benadryl, and may take Zyrtec, Claritin, or Allegra) If you experience trouble breathing, this can be serious. If it is severe call 911 IMMEDIATELY. If it is mild, please call our office.  We will call to schedule your test 2-4 weeks out understanding that some insurance companies will need an authorization prior to the service being performed.   For more information and frequently asked questions, please visit our website : [email or web address]  For non-scheduling related questions, please contact the cardiac imaging nurse navigator should you have any questions/concerns: Cardiac Imaging Nurse Navigators Direct Office Dial: [phone]   For scheduling needs, including cancellations and rescheduling, please call [place], [phone].   Follow-Up:  Your physician recommends that you schedule a follow-up appointment in: 6 weeks  Any Other Special Instructions Will Be Listed Below (If Applicable). Thank you for choosing [facility]!     If you need a refill on your  cardiac medications before your next appointment, please call your pharmacy.

## 2024-04-09 ENCOUNTER — Encounter: Payer: Self-pay | Admitting: Cardiology

## 2024-05-17 ENCOUNTER — Ambulatory Visit: Attending: Nurse Practitioner | Admitting: Nurse Practitioner

## 2024-05-17 NOTE — Progress Notes (Deleted)
  Cardiology Office Note   Date:  05/17/2024  ID:  Tammie Bradshaw, DOB 09/07/1975, MRN [id number] PCP: Dow Longs, PA-C  [facility] Providers Cardiologist:  Alvan Carrier, MD { Click to update primary MD,subspecialty MD or APP then REFRESH:1}    History of Present Illness Tammie Bradshaw is a 48 y.o. female with a PMH of chest pain, hyperlipidemia, aortic atherosclerosis, tobacco abuse, hypertension, who presents today for 6-week follow-up appointment.  Last seen by Dr. Alvan on 04/08/2024. Pt noted CP and had multiple CAD risk factors.  She had a more recent GXT that showed 1 mm ST depressions in inferior leads.  Coronary CTA was arranged for further evaluation.  See full report below.  Noted to have elevated BP, Norvasc  5 mg daily was started, Toprol -XL was stopped.  Today she is here for follow-up visit.  She states...  ROS: ***  Studies Reviewed      *** Risk Assessment/Calculations {Does this patient have ATRIAL FIBRILLATION?[phone]} No BP recorded.  {Refresh Note OR Click here to enter BP  :1}***       Physical Exam VS:  There were no vitals taken for this visit.       Wt Readings from Last 3 Encounters:  04/08/24 183 lb 9.6 oz (83.3 kg)  04/10/17 199 lb 12.8 oz (90.6 kg)  01/28/17 199 lb (90.3 kg)    GEN: Well nourished, well developed in no acute distress NECK: No JVD; No carotid bruits CARDIAC: ***RRR, no murmurs, rubs, gallops RESPIRATORY:  Clear to auscultation without rales, wheezing or rhonchi  ABDOMEN: Soft, non-tender, non-distended EXTREMITIES:  No edema; No deformity   ASSESSMENT AND PLAN ***    {Are you ordering a CV Procedure (e.g. stress test, cath, DCCV, TEE, etc)?   Press F2        :[id number]}  Dispo: ***  Signed, Almarie Crate, NP

## 2024-05-28 ENCOUNTER — Encounter (HOSPITAL_COMMUNITY): Payer: Self-pay
